# Patient Record
Sex: Male | Born: 1980 | Race: White | Hispanic: No | Marital: Married | Smoking: Former smoker
Health system: Southern US, Community
[De-identification: ages and names within clinical notes are randomized; demographics above are authoritative.]

## PROBLEM LIST (undated history)

## (undated) DIAGNOSIS — N419 Inflammatory disease of prostate, unspecified: Secondary | ICD-10-CM

## (undated) DIAGNOSIS — M1990 Unspecified osteoarthritis, unspecified site: Secondary | ICD-10-CM

## (undated) HISTORY — PX: APPENDECTOMY: SHX54

---

## 2006-10-14 ENCOUNTER — Inpatient Hospital Stay: Payer: Self-pay | Admitting: Vascular Surgery

## 2006-11-04 ENCOUNTER — Emergency Department: Payer: Self-pay | Admitting: Emergency Medicine

## 2007-07-25 ENCOUNTER — Emergency Department: Payer: Self-pay | Admitting: Emergency Medicine

## 2008-01-01 ENCOUNTER — Emergency Department: Payer: Self-pay | Admitting: Emergency Medicine

## 2008-01-27 ENCOUNTER — Emergency Department: Payer: Self-pay | Admitting: Emergency Medicine

## 2009-02-17 ENCOUNTER — Emergency Department: Payer: Self-pay | Admitting: Emergency Medicine

## 2009-06-22 ENCOUNTER — Emergency Department: Payer: Self-pay | Admitting: Emergency Medicine

## 2009-06-24 ENCOUNTER — Emergency Department: Payer: Self-pay | Admitting: Emergency Medicine

## 2009-07-07 ENCOUNTER — Inpatient Hospital Stay: Payer: Self-pay | Admitting: Psychiatry

## 2010-06-06 ENCOUNTER — Emergency Department: Payer: Self-pay | Admitting: Emergency Medicine

## 2011-01-11 ENCOUNTER — Emergency Department: Payer: Self-pay | Admitting: Emergency Medicine

## 2011-05-28 ENCOUNTER — Encounter: Payer: Self-pay | Admitting: *Deleted

## 2011-05-28 ENCOUNTER — Emergency Department (HOSPITAL_COMMUNITY)
Admission: EM | Admit: 2011-05-28 | Discharge: 2011-05-28 | Disposition: A | Discharge status: Home / Self Care | Payer: Self-pay | Attending: Emergency Medicine | Admitting: Emergency Medicine

## 2011-05-28 DIAGNOSIS — J45909 Unspecified asthma, uncomplicated: Secondary | ICD-10-CM | POA: Insufficient documentation

## 2011-05-28 DIAGNOSIS — F17200 Nicotine dependence, unspecified, uncomplicated: Secondary | ICD-10-CM | POA: Insufficient documentation

## 2011-05-28 DIAGNOSIS — N419 Inflammatory disease of prostate, unspecified: Secondary | ICD-10-CM | POA: Insufficient documentation

## 2011-05-28 HISTORY — DX: Inflammatory disease of prostate, unspecified: N41.9

## 2011-05-28 MED ORDER — IBUPROFEN 800 MG PO TABS
800.0000 mg | ORAL_TABLET | Freq: Once | ORAL | Status: AC
  Administered 2011-05-28: 800 mg via ORAL
  Filled 2011-05-28: qty 1

## 2011-05-28 MED ORDER — CIPROFLOXACIN HCL 250 MG PO TABS
500.0000 mg | ORAL_TABLET | Freq: Once | ORAL | Status: AC
  Administered 2011-05-28: 500 mg via ORAL
  Filled 2011-05-28: qty 2

## 2011-05-28 MED ORDER — CIPROFLOXACIN HCL 500 MG PO TABS: 500.0000 mg | ORAL_TABLET | Freq: Two times a day (BID) | ORAL | Status: AC

## 2011-05-28 MED ORDER — IBUPROFEN 800 MG PO TABS: 800.0000 mg | ORAL_TABLET | Freq: Three times a day (TID) | ORAL | Status: AC

## 2011-05-28 NOTE — ED Notes (Signed)
Pt c/o of pain to the area between his rectum and testicles.

## 2011-05-28 NOTE — ED Notes (Signed)
Pt not given prescriptions for meds, Pt called at home and notifed that he needs to come get prescriptions; pt states he will be here later today to pick up prescrips

## 2011-05-28 NOTE — ED Provider Notes (Signed)
History     CSN: [id number] Arrival date & time: 05/28/2011  1:55 AM   Chief Complaint  Patient presents with  . Groin Pain     (Include location/radiation/quality/duration/timing/severity/associated sxs/prior treatment) HPI Comments: Seen 0212  Patient is a 30 y.o. male presenting with groin pain. The history is provided by the patient.  Groin Pain This is a new (Patient with h/o prostatitis now with similar symptoms, pressure and discomfort to the perineal ara.) problem. The current episode started yesterday. The problem has not changed since onset.Exacerbated by: more prevalent when bearing down to have a BM or urinate. The symptoms are relieved by nothing. He has tried nothing for the symptoms.     Past Medical History  Diagnosis Date  . Asthma   . Prostatitis      Past Surgical History  Procedure Date  . Appendectomy     History reviewed. No pertinent family history.  History  Substance Use Topics  . Smoking status: Current Everyday Smoker  . Smokeless tobacco: Not on file  . Alcohol Use: No      Review of Systems  Genitourinary:       Prostate pain    Allergies  Review of patient's allergies indicates no known allergies.  Home Medications  No current outpatient prescriptions on file.  Physical Exam    BP 138/83  Pulse 71  Temp 98.4 F (36.9 C)  Resp 20  Ht 6\' 2"  (1.88 m)  Wt 230 lb (104.327 kg)  BMI 29.53 kg/m2  SpO2 98%  Physical Exam  Nursing note and vitals reviewed. Constitutional: He is oriented to person, place, and time. He appears well-developed and well-nourished.  HENT:  Head: Normocephalic and atraumatic.  Eyes: EOM are normal.  Neck: Normal range of motion.  Cardiovascular: Normal rate and normal heart sounds.   Pulmonary/Chest: Effort normal and breath sounds normal.  Abdominal: Soft.  Genitourinary: Penis normal.       Prostate tenderness  Musculoskeletal: Normal range of motion.  Neurological: He is alert and oriented  to person, place, and time.  Skin: Skin is warm and dry.    ED Course  Procedures  Patient with h/o prostatitis with similar pain that began yesterday. Began treatment with ciprofloxacin. Referral to urology for follow up. Patient / Family / Caregiver informed of clinical course, understand medical decision-making process, and agree with plan. MDM Reviewed: nursing note and vitals          Nicoletta Dress. Colon Branch, MD 05/28/11 [phone]

## 2011-06-20 ENCOUNTER — Emergency Department (HOSPITAL_COMMUNITY)
Admission: EM | Admit: 2011-06-20 | Discharge: 2011-06-20 | Disposition: A | Discharge status: Home / Self Care | Payer: Self-pay | Attending: Emergency Medicine | Admitting: Emergency Medicine

## 2011-06-20 ENCOUNTER — Encounter (HOSPITAL_COMMUNITY): Payer: Self-pay | Admitting: *Deleted

## 2011-06-20 DIAGNOSIS — F17200 Nicotine dependence, unspecified, uncomplicated: Secondary | ICD-10-CM | POA: Insufficient documentation

## 2011-06-20 DIAGNOSIS — R112 Nausea with vomiting, unspecified: Secondary | ICD-10-CM | POA: Insufficient documentation

## 2011-06-20 DIAGNOSIS — R109 Unspecified abdominal pain: Secondary | ICD-10-CM | POA: Insufficient documentation

## 2011-06-20 DIAGNOSIS — R197 Diarrhea, unspecified: Secondary | ICD-10-CM | POA: Insufficient documentation

## 2011-06-20 LAB — COMPREHENSIVE METABOLIC PANEL
AST: 18 U/L (ref 0–37)
BUN: 22 mg/dL (ref 6–23)
CO2: 27 mEq/L (ref 19–32)
Chloride: 100 mEq/L (ref 96–112)
Creatinine, Ser: 0.92 mg/dL (ref 0.50–1.35)
GFR calc non Af Amer: >90 mL/min (ref >90)
Total Bilirubin: 0.7 mg/dL (ref 0.3–1.2)

## 2011-06-20 LAB — CBC
HCT: 48.1 % (ref 39.0–52.0)
Hemoglobin: 17 g/dL (ref 13.0–17.0)
MCH: 29.6 pg (ref 26.0–34.0)
MCHC: 35.3 g/dL (ref 30.0–36.0)
MCV: 83.7 fL (ref 78.0–100.0)
Platelets: 157 10*3/uL (ref 150–400)
RBC: 5.75 MIL/uL (ref 4.22–5.81)
RDW: 12.3 % (ref 11.5–15.5)
WBC: 4.1 10*3/uL (ref 4.0–10.5)

## 2011-06-20 LAB — DIFFERENTIAL
Basophils Absolute: 0 10*3/uL (ref 0.0–0.1)
Basophils Relative: 1 % (ref 0–1)
Eosinophils Absolute: 0.2 10*3/uL (ref 0.0–0.7)
Eosinophils Relative: 6 % — ABNORMAL HIGH (ref 0–5)
Lymphocytes Relative: 35 % (ref 12–46)
Lymphs Abs: 1.4 10*3/uL (ref 0.7–4.0)
Monocytes Absolute: 0.8 10*3/uL (ref 0.1–1.0)
Monocytes Relative: 18 % — ABNORMAL HIGH (ref 3–12)
Neutro Abs: 1.7 10*3/uL (ref 1.7–7.7)
Neutrophils Relative %: 41 % — ABNORMAL LOW (ref 43–77)

## 2011-06-20 MED ORDER — SODIUM CHLORIDE 0.9 % IV SOLN
999.0000 mL | Freq: Once | INTRAVENOUS | Status: AC
  Administered 2011-06-20: 999 mL via INTRAVENOUS

## 2011-06-20 MED ORDER — ONDANSETRON HCL 4 MG/2ML IJ SOLN
4.0000 mg | Freq: Once | INTRAMUSCULAR | Status: AC
  Administered 2011-06-20: 4 mg via INTRAVENOUS
  Filled 2011-06-20: qty 2

## 2011-06-20 MED ORDER — ONDANSETRON HCL 4 MG PO TABS: 4.0000 mg | ORAL_TABLET | Freq: Four times a day (QID) | ORAL | Status: AC

## 2011-06-20 NOTE — ED Provider Notes (Signed)
Scribed for Gerhard Munch, MD, the patient was seen in room APA06/APA06 . This chart was scribed by Ellie Lunch. This patient's care was started at 5:25 PM.   CSN: [id number] Arrival date & time: 06/20/2011  5:17 PM  Chief Complaint  Patient presents with  . Abdominal Pain  . Emesis  . Diarrhea    (Consider location/radiation/quality/duration/timing/severity/associated sxs/prior treatment) HPI Edward Griffin is a 30 y.o. male who presents to the Emergency Department complaining of nausea, vomiting, and diarrhea with associated abdominal cramping starting 2 days ago. Pt reports he began to feel ill 3 days ago and began to have n/v/d 2 days ago. He has not been able to hold any food or drink down since then. Pt reports n/v/d is aggravated by eating. Pt treated sx with motion sickness medicine and IB profen with no improvement. Also reports body aches, HA ( X1day), with some slurred speech (difficulty pronouncing words). Denies confusion, SOB, CP, cough, swelling, or urinary Sx.  Additionally Pt was here in ED ~ 3 weeks ago for prostatitis was given cipro. Last dose was ~2 weeks ago.   Past Medical History  Diagnosis Date  . Asthma   . Prostatitis 2 times     Past Surgical History  Procedure Date  . Appendectomy     History reviewed. No pertinent family history.  History  Substance Use Topics  . Smoking status: Current Everyday Smoker -- 0.5 packs/day  . Smokeless tobacco: Not on file  . Alcohol Use: No    Review of Systems 10 Systems reviewed and are negative for acute change except as noted in the HPI.  Allergies  Review of patient's allergies indicates no known allergies.  Home Medications  No current outpatient prescriptions on file.  BP 128/79  Pulse 94  Temp(Src) 97.6 F (36.4 C) (Oral)  Resp 20  Ht 6\' 1"  (1.854 m)  Wt 225 lb (102.059 kg)  BMI 29.69 kg/m2  SpO2 99%  Physical Exam  Nursing note and vitals reviewed. Constitutional: He is oriented to  person, place, and time. He appears well-developed and well-nourished.  HENT:  Head: Normocephalic and atraumatic.       Mucous membranes moist.  Eyes: Conjunctivae are normal. Pupils are equal, round, and reactive to light.  Neck: Neck supple.  Cardiovascular: Normal rate, regular rhythm, normal heart sounds and intact distal pulses.   No murmur heard. Pulmonary/Chest: Effort normal and breath sounds normal. No respiratory distress.  Abdominal: Soft. There is tenderness (LUQ tenderness).       No CVA tenderness  Musculoskeletal: He exhibits no edema.  Neurological: He is alert and oriented to person, place, and time.  Skin: Skin is warm and dry.  Psychiatric: He has a normal mood and affect.   Procedures (including critical care time)  OTHER DATA REVIEWED: Nursing notes, vital signs, and past medical records reviewed.  DIAGNOSTIC STUDIES: Oxygen Saturation is 99% on room air, normal by my interpretation.    LABS / RADIOLOGY:  Labs Reviewed  DIFFERENTIAL - Abnormal; Notable for the following:    Neutrophils Relative 41 (*)    Monocytes Relative 18 (*)    Eosinophils Relative 6 (*)    All other components within normal limits  CBC  COMPREHENSIVE METABOLIC PANEL  LIPASE, BLOOD    ED COURSE / COORDINATION OF CARE: 17:30 EDP at PT bedside discussed diagnostic possibilities with PT including viral or bacterial infection. Plan to treat IV fluids, nausea medicine, check blood work.   MDM: This well-appearing young  male presents with several days of nausea, vomiting, and diarrhea. Physical exam is unremarkable. Labs did not demonstrate acute findings. Following IV fluids, and anti-emetics, the patient tolerance of liquids and noted that he feels significantly better. Absent any acute findings, or fever, or notable lab results, the patient's presentation is most consistent with viral gastroenteritis. He was discharged with antiemetics, return precautions, and PMD followup  MEDICATIONS  GIVEN IN THE E.D.  Medications  ondansetron (ZOFRAN) injection 4 mg (not administered)  0.9 %  sodium chloride infusion (not administered)   SCRIBE ATTESTATION: I personally performed the services described in this documentation, which was scribed in my presence. The recorded information has been reviewed and considered.         Gerhard Munch, MD 06/20/11 [id number]

## 2011-06-20 NOTE — ED Notes (Signed)
Pt c/o abd pain with n/v/d. Pt states he is not able to hold any food or drink down since Sunday of this week.

## 2011-10-27 ENCOUNTER — Emergency Department: Payer: Self-pay | Admitting: Emergency Medicine

## 2011-10-27 LAB — URINALYSIS, COMPLETE
Bacteria: NONE SEEN
Bilirubin,UR: NEGATIVE
Glucose,UR: 50 mg/dL (ref 0–75)
Leukocyte Esterase: NEGATIVE
RBC,UR: NONE SEEN /HPF (ref 0–5)
Squamous Epithelial: NONE SEEN

## 2011-11-15 ENCOUNTER — Emergency Department: Payer: Self-pay | Admitting: Emergency Medicine

## 2012-05-14 ENCOUNTER — Emergency Department: Payer: Self-pay | Admitting: Emergency Medicine

## 2013-03-07 ENCOUNTER — Ambulatory Visit: Payer: Self-pay | Admitting: Family Medicine

## 2013-03-09 ENCOUNTER — Ambulatory Visit: Payer: Self-pay

## 2013-03-18 ENCOUNTER — Ambulatory Visit: Payer: Self-pay | Admitting: Family Medicine

## 2014-03-23 ENCOUNTER — Emergency Department: Payer: Self-pay | Admitting: Emergency Medicine

## 2014-07-22 ENCOUNTER — Emergency Department: Payer: Self-pay | Admitting: Student

## 2014-07-23 ENCOUNTER — Emergency Department: Payer: Self-pay | Admitting: Student

## 2014-11-28 ENCOUNTER — Emergency Department: Payer: Self-pay

## 2015-05-07 ENCOUNTER — Other Ambulatory Visit: Payer: Self-pay | Admitting: Neurosurgery

## 2015-05-07 DIAGNOSIS — M5416 Radiculopathy, lumbar region: Secondary | ICD-10-CM

## 2015-05-12 ENCOUNTER — Ambulatory Visit
Admission: RE | Admit: 2015-05-12 | Discharge: 2015-05-12 | Disposition: A | Discharge status: Home / Self Care | Payer: Worker's Compensation | Source: Ambulatory Visit | Attending: Neurosurgery | Admitting: Neurosurgery

## 2015-05-12 DIAGNOSIS — M5416 Radiculopathy, lumbar region: Secondary | ICD-10-CM

## 2015-05-12 MED ORDER — MEPERIDINE HCL 100 MG/ML IJ SOLN
75.0000 mg | Freq: Once | INTRAMUSCULAR | Status: AC
  Administered 2015-05-12: 75 mg via INTRAMUSCULAR

## 2015-05-12 MED ORDER — ONDANSETRON HCL 4 MG/2ML IJ SOLN
4.0000 mg | Freq: Once | INTRAMUSCULAR | Status: AC
  Administered 2015-05-12: 4 mg via INTRAMUSCULAR

## 2015-05-12 MED ORDER — DIAZEPAM 5 MG PO TABS
10.0000 mg | ORAL_TABLET | Freq: Once | ORAL | Status: AC
  Administered 2015-05-12: 10 mg via ORAL

## 2015-05-12 MED ORDER — IOHEXOL 180 MG/ML  SOLN
15.0000 mL | Freq: Once | INTRAMUSCULAR | Status: DC | PRN
  Administered 2015-05-12: 15 mL via INTRATHECAL

## 2015-05-12 NOTE — Discharge Instructions (Signed)

## 2015-05-13 ENCOUNTER — Encounter: Payer: Self-pay | Admitting: Internal Medicine

## 2015-05-13 ENCOUNTER — Observation Stay
Admission: EM | Admit: 2015-05-13 | Discharge: 2015-05-15 | Disposition: A | Discharge status: Home / Self Care | Payer: Worker's Compensation | Attending: Internal Medicine | Admitting: Internal Medicine

## 2015-05-13 DIAGNOSIS — Z888 Allergy status to other drugs, medicaments and biological substances status: Secondary | ICD-10-CM | POA: Insufficient documentation

## 2015-05-13 DIAGNOSIS — F17200 Nicotine dependence, unspecified, uncomplicated: Secondary | ICD-10-CM | POA: Diagnosis not present

## 2015-05-13 DIAGNOSIS — J45909 Unspecified asthma, uncomplicated: Secondary | ICD-10-CM

## 2015-05-13 DIAGNOSIS — M79605 Pain in left leg: Secondary | ICD-10-CM | POA: Insufficient documentation

## 2015-05-13 DIAGNOSIS — M4806 Spinal stenosis, lumbar region: Secondary | ICD-10-CM | POA: Diagnosis not present

## 2015-05-13 DIAGNOSIS — M5416 Radiculopathy, lumbar region: Secondary | ICD-10-CM | POA: Diagnosis present

## 2015-05-13 DIAGNOSIS — R51 Headache: Secondary | ICD-10-CM | POA: Diagnosis not present

## 2015-05-13 DIAGNOSIS — Z91030 Bee allergy status: Secondary | ICD-10-CM | POA: Diagnosis not present

## 2015-05-13 DIAGNOSIS — G971 Other reaction to spinal and lumbar puncture: Principal | ICD-10-CM | POA: Diagnosis present

## 2015-05-13 DIAGNOSIS — R200 Anesthesia of skin: Secondary | ICD-10-CM | POA: Insufficient documentation

## 2015-05-13 DIAGNOSIS — M542 Cervicalgia: Secondary | ICD-10-CM | POA: Diagnosis not present

## 2015-05-13 DIAGNOSIS — Z79899 Other long term (current) drug therapy: Secondary | ICD-10-CM | POA: Insufficient documentation

## 2015-05-13 DIAGNOSIS — M1990 Unspecified osteoarthritis, unspecified site: Secondary | ICD-10-CM | POA: Diagnosis not present

## 2015-05-13 DIAGNOSIS — R599 Enlarged lymph nodes, unspecified: Secondary | ICD-10-CM | POA: Insufficient documentation

## 2015-05-13 HISTORY — DX: Unspecified osteoarthritis, unspecified site: M19.90

## 2015-05-13 MED ORDER — SODIUM CHLORIDE 0.9 % IV SOLN
Freq: Once | INTRAVENOUS | Status: AC
  Administered 2015-05-13: 23:00:00 via INTRAVENOUS

## 2015-05-13 NOTE — ED Provider Notes (Signed)
[facility] Emergency Department Provider Note  ____________________________________________  Time seen: Approximately 10:44 PM  I have reviewed the triage vital signs and the nursing notes.   HISTORY  Chief Complaint Headache; Arm Pain; and Neck Pain    HPI Edward Griffin is a 34 y.o. male who had a lumbar myelogram yesterday. Patient reports when he got home afterwards she began to get a headache. This headache became bad when he stood up and disappeared when he laid down. He began drinking lots of fluid began drinking caffeinated beverages. However the headache continued to worsen and while it initially would go away rapidly when he laid down. It now no longer goes Y completely but it does worsen when he sits up or stands up and he says the bathroom break results in a severe headache that has now progressed to include his neck and his left arm. This headache in the next headache improves when he lays down and neckache and arm pain goes away when he lays down but returns again if he has to go the bathroom patient denies any fever patient denies any neck pain if he is lying down or pain with eye movement either.   Past Medical History  Diagnosis Date  . Asthma   . Prostatitis     There are no active problems to display for this patient.   Past Surgical History  Procedure Laterality Date  . Appendectomy      Current Outpatient Rx  Name  Route  Sig  Dispense  Refill  . albuterol (VENTOLIN HFA) 108 (90 BASE) MCG/ACT inhaler   Inhalation   Inhale 2 puffs into the lungs every 6 (six) hours as needed. For shortness of breath            Allergies Bee venom and Gabapentin  No family history on file.  Social History Social History  Substance Use Topics  . Smoking status: Current Every Day Smoker -- 0.50 packs/day  . Smokeless tobacco: Not on file  . Alcohol Use: No    Review of Systems Constitutional: No fever/chills Eyes: No visual  changes while he is laying down. When he stands up now he also has blurry vision along with the neck pain in the arm pain. This again resolved when he lays down ENT: No sore throat. Cardiovascular: Denies chest pain. Respiratory: Denies shortness of breath. Gastrointestinal: No abdominal pain.  No nausea, no vomiting.  No diarrhea.  No constipation. Genitourinary: Negative for dysuria. Musculoskeletal: Negative for back pain. Skin: Negative for rash. Neurological: Negative for headaches, focal weakness or numbness.  10-point ROS otherwise negative.  ____________________________________________   PHYSICAL EXAM:  VITAL SIGNS: ED Triage Vitals  Enc Vitals Group     BP 05/13/15 2200 161/96 mmHg     Pulse Rate 05/13/15 2200 79     Resp 05/13/15 2200 18     Temp 05/13/15 2200 97.6 F (36.4 C)     Temp Source 05/13/15 2200 Oral     SpO2 05/13/15 2200 97 %     Weight 05/13/15 2200 240 lb (108.863 kg)     Height 05/13/15 2200 6\' 1"  (1.854 m)     Head Cir --      Peak Flow --      Pain Score 05/13/15 2201 10     Pain Loc --      Pain Edu? --      Excl. in GC? --     Constitutional: Alert and oriented. Well  appearing and in no acute distress. Eyes: Conjunctivae are normal. PERRL. EOMI. Head: Atraumatic. Nose: No congestion/rhinnorhea. Mouth/Throat: Mucous membranes are moist.  Oropharynx non-erythematous. Neck: No stridor.   Cardiovascular: Normal rate, regular rhythm. Grossly normal heart sounds.  Good peripheral circulation. Respiratory: Normal respiratory effort.  No retractions. Lungs CTAB. Gastrointestinal: Soft and nontender. No distention. No abdominal bruits. No CVA tenderness. Musculoskeletal: No lower extremity tenderness nor edema.  No joint effusions. Neurologic:  Normal speech and language. No gross focal neurologic deficits are appreciated. Cranial nerves II through XII are intact. Finger to nose and rapid alternating movements and hands are normal. Her strength is 5  over 5 throughout except for his left leg which she reports even pushing down on his leg to test his strength causes pain from his sciatica. He has no new weakness in the leg however. Skin:  Skin is warm, dry and intact. No rash noted. Psychiatric: Mood and affect are normal. Speech and behavior are normal.  ____________________________________________   LABS (all labs ordered are listed, but only abnormal results are displayed)  Labs Reviewed - No data to display ____________________________________________  EKG   ____________________________________________  RADIOLOGY   ____________________________________________   PROCEDURES   ____________________________________________   INITIAL IMPRESSION / ASSESSMENT AND PLAN / ED COURSE  Pertinent labs & imaging results that were available during my care of the patient were reviewed by me and considered in my medical decision making (see chart for details).   ____________________________________________   FINAL CLINICAL IMPRESSION(S) / ED DIAGNOSES  Final diagnoses:  Spinal headache      Arnaldo Natal, MD 05/13/15 2313

## 2015-05-13 NOTE — H&P (Signed)
[facility] - [place] at [facility]   PATIENT NAME: Edward Griffin    MR#:  [id number]  DATE OF BIRTH:  [birth year]  DATE OF ADMISSION:  05/13/2015  PRIMARY CARE PHYSICIAN: No primary care provider on file. [facility]  REQUESTING/REFERRING PHYSICIAN: Marge Duncans  CHIEF COMPLAINT:   Chief Complaint  Patient presents with  . Headache  . Arm Pain  . Neck Pain    HISTORY OF PRESENT ILLNESS:  Edward Griffin  is a 35 y.o. male with a known history of lumbar adenopathy, bronchial asthma presents to the emergency room with the complaints of severe headache following lumbar puncture to undergo lumbar myelogram done on 05/12/2015 for evaluation of lumbar adenopathy. Headache increases on standing elaboration and resolves with lying down. Associated neck and arm pain present but denies any loss of sensations are power. Feels slight blurred vision on ambulation but no visual problems at rest. No history of any fever, chills, shortness of breath, chest pain, nausea, vomiting, diarrhea, dysuria, bladder or bowel disturbances. Denies any focal weakness or numbness. Chronic sciatica +.   PAST MEDICAL HISTORY:   Past Medical History  Diagnosis Date  . Asthma   . Prostatitis   . Arthritis     PAST SURGICAL HISTORY:   Past Surgical History  Procedure Laterality Date  . Appendectomy      SOCIAL HISTORY:   Social History  Substance Use Topics  . Smoking status: Current Every Day Smoker -- 0.50 packs/day  . Smokeless tobacco: Not on file  . Alcohol Use: No    FAMILY HISTORY:   Family History  Problem Relation Age of Onset  . Heart disease Mother   . Thyroid disease Mother   . Hypertension Father   . Diabetes Other     DRUG ALLERGIES:   Allergies  Allergen Reactions  . Bee Venom Shortness Of Breath and Swelling  . Gabapentin Other (See Comments)    Reaction: Flu-like symptoms     REVIEW OF SYSTEMS:   Review of Systems   Constitutional: Negative for fever, chills and malaise/fatigue.  HENT: Negative for ear pain, hearing loss, nosebleeds, sore throat and tinnitus.   Eyes: Positive for blurred vision (On standing but no visual symptoms while lying down). Negative for double vision, pain, discharge and redness.  Respiratory: Negative for cough, hemoptysis, sputum production, shortness of breath and wheezing.   Cardiovascular: Negative for chest pain, palpitations, orthopnea and leg swelling.  Gastrointestinal: Negative for nausea, vomiting, abdominal pain, diarrhea, constipation, blood in stool and melena.  Genitourinary: Negative for dysuria, urgency, frequency and hematuria.  Musculoskeletal: Positive for back pain (chronic +). Negative for joint pain and neck pain.  Skin: Negative for itching and rash.  Neurological: Positive for headaches. Negative for dizziness, tingling, sensory change, focal weakness and seizures.  Endo/Heme/Allergies: Does not bruise/bleed easily.  Psychiatric/Behavioral: Negative for depression. The patient is not nervous/anxious.     MEDICATIONS AT HOME:   Prior to Admission medications   Medication Sig Start Date End Date Taking? Authorizing Provider  albuterol (VENTOLIN HFA) 108 (90 BASE) MCG/ACT inhaler Inhale 2 puffs into the lungs every 6 (six) hours as needed. For shortness of breath     Historical Provider, MD      VITAL SIGNS:  Blood pressure 130/75, pulse 68, temperature 97.6 F (36.4 C), temperature source Oral, resp. rate 18, height 6\' 1"  (1.854 m), weight 108.863 kg (240 lb), SpO2 97 %.  PHYSICAL EXAMINATION:  Physical Exam  Constitutional: He  is oriented to person, place, and time. He appears well-developed and well-nourished.  Mild distress + because of headache  HENT:  Head: Normocephalic and atraumatic.  Right Ear: External ear normal.  Left Ear: External ear normal.  Nose: Nose normal.  Mouth/Throat: Oropharynx is clear and moist. No oropharyngeal  exudate.  Eyes: EOM are normal. Pupils are equal, round, and reactive to light. No scleral icterus.  Neck: Normal range of motion. Neck supple. No JVD present. No thyromegaly present.  Cardiovascular: Normal rate, regular rhythm, normal heart sounds and intact distal pulses.  Exam reveals no friction rub.   No murmur heard. Respiratory: Effort normal and breath sounds normal. No respiratory distress. He has no wheezes. He has no rales. He exhibits no tenderness.  GI: Soft. Bowel sounds are normal. He exhibits no distension and no mass. There is no tenderness. There is no rebound and no guarding.  Musculoskeletal: Normal range of motion. He exhibits no edema.  Lymphadenopathy:    He has no cervical adenopathy.  Neurological: He is alert and oriented to person, place, and time. He has normal reflexes. He displays normal reflexes. No cranial nerve deficit. He exhibits normal muscle tone.  Skin: Skin is warm. No rash noted. No erythema.  Psychiatric: He has a normal mood and affect. His behavior is normal. Thought content normal.   LABORATORY PANEL:   CBC No results for input(s): WBC, HGB, HCT, PLT in the last 168 hours. ------------------------------------------------------------------------------------------------------------------  Chemistries  No results for input(s): NA, K, CL, CO2, GLUCOSE, BUN, CREATININE, CALCIUM, MG, AST, ALT, ALKPHOS, BILITOT in the last 168 hours.  Invalid input(s): GFRCGP ------------------------------------------------------------------------------------------------------------------  Cardiac Enzymes No results for input(s): TROPONINI in the last 168 hours. ------------------------------------------------------------------------------------------------------------------  RADIOLOGY:  Ct Lumbar Spine W Contrast  05/12/2015   CLINICAL DATA:  Lumbar radiculopathy. Left-sided low back/buttock pain radiating through the posterior left lower extremity.  EXAM: LUMBAR  MYELOGRAM  FLUOROSCOPY TIME:  Fluoroscopy Time (in minutes and seconds): 0 minutes 53 seconds  Number of Acquired Images:  14  PROCEDURE: After thorough discussion of risks and benefits of the procedure including bleeding, infection, injury to nerves, blood vessels, adjacent structures as well as headache and CSF leak, written and oral informed consent was obtained. Consent was obtained by Dr. Sebastian Ache. Time out form was completed.  Patient was positioned prone on the fluoroscopy table. Local anesthesia was provided with 1% lidocaine without epinephrine after prepped and draped in the usual sterile fashion. Puncture was performed at L3-4 using a 3 1/2 inch 22-gauge spinal needle via a right paramedian approach. Using a single pass through the dura, the needle was placed within the thecal sac, with return of clear CSF. 15 mL of Omnipaque-180 was injected into the thecal sac, with normal opacification of the nerve roots and cauda equina consistent with free flow within the subarachnoid space.  I personally performed the lumbar puncture and administered the intrathecal contrast. I also personally supervised acquisition of the myelogram images.  TECHNIQUE: Contiguous axial images were obtained through the Lumbar spine after the intrathecal infusion of contrast. Coronal and sagittal reconstructions were obtained of the axial image sets.  COMPARISON:  Lumbar spine MRI 12/25/2014 from [facility]. Lumbar spine radiographs 11/28/2014 and 06/06/2010.  FINDINGS: LUMBAR MYELOGRAM FINDINGS:  There is transitional lumbosacral anatomy. For the purposes of this dictation, the transitional level will be considered a partially sacralized L5 with left-sided assimilation joint with the sacrum.  Minimal anterior wedging of the L1-L3 vertebral bodies appears chronic. There  is no significant listhesis with upright neutral, flexion, or extension positioning. Small ventral extradural defects are present from L2-3 to L5-S1, most  notable at L4-5 and slightly increased with upright compared to prone positioning. There is attenuation of both L5 nerve root sleeves.  CT LUMBAR MYELOGRAM FINDINGS:  Vertebral alignment is near anatomic, with at most trace retrolisthesis of L3 on L4 and L4 on L5. There is minimal anterior wedging of the L1-L3 vertebral bodies which appears chronic. A few small Schmorl's nodes are noted. The conus medullaris terminates at T12. A small amount of subdural contrast is noted in the lower lumbar spine reflecting a slightly mixed injection. Paraspinal soft tissues are unremarkable.  L1-2:  Negative.  L2-3: Mild disc bulging results in minimal bilateral lateral recess narrowing without spinal canal or neural foraminal stenosis, unchanged.  L3-4: Mild disc bulging and mild facet hypertrophy result in minimal bilateral lateral recess narrowing and borderline spinal stenosis without neural foraminal stenosis, not significantly changed.  L4-5: A moderate-sized left subarticular disc extrusion results in severe left lateral recess stenosis with likely impingement of the left L5 nerve root, which appeared enlarged/edematous on the prior outside MRI. The disc extrusion, mild circumferential disc bulging, and moderate facet and ligamentum flavum hypertrophy also result in mild right lateral recess stenosis and moderate spinal stenosis, with the findings overall slightly more prominent than on the prior MRI.  L5-S1:  Mild facet arthrosis without stenosis.  IMPRESSION: 1. Left subarticular disc extrusion at L4-5 with likely left L5 nerve root impingement in the lateral recess. Moderate spinal stenosis at L4-5. 2. Mild disc and facet degeneration elsewhere as above without evidence of neural impingement.   Electronically Signed   By: Sebastian Ache M.D.   On: 05/12/2015 15:57   Dg Myelography Lumbar Inj Lumbosacral  05/12/2015   CLINICAL DATA:  Lumbar radiculopathy. Left-sided low back/buttock pain radiating through the posterior  left lower extremity.  EXAM: LUMBAR MYELOGRAM  FLUOROSCOPY TIME:  Fluoroscopy Time (in minutes and seconds): 0 minutes 53 seconds  Number of Acquired Images:  14  PROCEDURE: After thorough discussion of risks and benefits of the procedure including bleeding, infection, injury to nerves, blood vessels, adjacent structures as well as headache and CSF leak, written and oral informed consent was obtained. Consent was obtained by Dr. Sebastian Ache. Time out form was completed.  Patient was positioned prone on the fluoroscopy table. Local anesthesia was provided with 1% lidocaine without epinephrine after prepped and draped in the usual sterile fashion. Puncture was performed at L3-4 using a 3 1/2 inch 22-gauge spinal needle via a right paramedian approach. Using a single pass through the dura, the needle was placed within the thecal sac, with return of clear CSF. 15 mL of Omnipaque-180 was injected into the thecal sac, with normal opacification of the nerve roots and cauda equina consistent with free flow within the subarachnoid space.  I personally performed the lumbar puncture and administered the intrathecal contrast. I also personally supervised acquisition of the myelogram images.  TECHNIQUE: Contiguous axial images were obtained through the Lumbar spine after the intrathecal infusion of contrast. Coronal and sagittal reconstructions were obtained of the axial image sets.  COMPARISON:  Lumbar spine MRI 12/25/2014 from [facility]. Lumbar spine radiographs 11/28/2014 and 06/06/2010.  FINDINGS: LUMBAR MYELOGRAM FINDINGS:  There is transitional lumbosacral anatomy. For the purposes of this dictation, the transitional level will be considered a partially sacralized L5 with left-sided assimilation joint with the sacrum.  Minimal anterior wedging of the  L1-L3 vertebral bodies appears chronic. There is no significant listhesis with upright neutral, flexion, or extension positioning. Small ventral extradural defects are  present from L2-3 to L5-S1, most notable at L4-5 and slightly increased with upright compared to prone positioning. There is attenuation of both L5 nerve root sleeves.  CT LUMBAR MYELOGRAM FINDINGS:  Vertebral alignment is near anatomic, with at most trace retrolisthesis of L3 on L4 and L4 on L5. There is minimal anterior wedging of the L1-L3 vertebral bodies which appears chronic. A few small Schmorl's nodes are noted. The conus medullaris terminates at T12. A small amount of subdural contrast is noted in the lower lumbar spine reflecting a slightly mixed injection. Paraspinal soft tissues are unremarkable.  L1-2:  Negative.  L2-3: Mild disc bulging results in minimal bilateral lateral recess narrowing without spinal canal or neural foraminal stenosis, unchanged.  L3-4: Mild disc bulging and mild facet hypertrophy result in minimal bilateral lateral recess narrowing and borderline spinal stenosis without neural foraminal stenosis, not significantly changed.  L4-5: A moderate-sized left subarticular disc extrusion results in severe left lateral recess stenosis with likely impingement of the left L5 nerve root, which appeared enlarged/edematous on the prior outside MRI. The disc extrusion, mild circumferential disc bulging, and moderate facet and ligamentum flavum hypertrophy also result in mild right lateral recess stenosis and moderate spinal stenosis, with the findings overall slightly more prominent than on the prior MRI.  L5-S1:  Mild facet arthrosis without stenosis.  IMPRESSION: 1. Left subarticular disc extrusion at L4-5 with likely left L5 nerve root impingement in the lateral recess. Moderate spinal stenosis at L4-5. 2. Mild disc and facet degeneration elsewhere as above without evidence of neural impingement.   Electronically Signed   By: Sebastian Ache M.D.   On: 05/12/2015 15:57    EKG:  No orders found for this or any previous visit.  IMPRESSION AND PLAN:   34 year old male with history of lumbar  adenopathy presents with severe headache following lumbar puncture to undergo lumbar myelogram on 05/12/2015. 1. Headache-spinal, following lumbar puncture. Normal neuro examination, no febrile illness. Plan: Admit for observation, bedrest, IV hydration, IV pain control medications. Consider blood patch application in a.m. if headache not controlled. 2. Left-sided lumbar radicle adenopathy, status post recent lumbar myelo gram. Stable on when necessary pain medications. 3. History of bronchial asthma, mild. Stable, no acute problems. Monitor. Will obtain CBC and BMP for baseline.    All the records are reviewed and case discussed with ED provider. Management plans discussed with the patient, family and they are in agreement.  CODE STATUS: Full code  TOTAL TIME TAKING CARE OF THIS PATIENT:  40 minutes.    Jonnie Kind N M.D on 05/13/2015 at 11:43 PM  Between 7am to 6pm - Pager - [phone]  After 6pm go to [email or web address] - password [email or web address] [facility]  [facility]  Office  [phone]  CC: Primary care physician; No primary care provider on file.

## 2015-05-13 NOTE — ED Notes (Signed)
Pt to ED c/o headache, and neck pain that radiates to L arm. Pt reports having myelogram yesterday.

## 2015-05-14 ENCOUNTER — Encounter: Payer: Self-pay | Admitting: Anesthesiology

## 2015-05-14 ENCOUNTER — Telehealth: Payer: Self-pay

## 2015-05-14 ENCOUNTER — Observation Stay: Payer: Worker's Compensation | Admitting: Registered Nurse

## 2015-05-14 LAB — CBC
HCT: 43.4 % (ref 40.0–52.0)
HEMOGLOBIN: 14.9 g/dL (ref 13.0–18.0)
MCH: 29.2 pg (ref 26.0–34.0)
MCHC: 34.3 g/dL (ref 32.0–36.0)
MCV: 85.3 fL (ref 80.0–100.0)
Platelets: 163 10*3/uL (ref 150–440)
RBC: 5.09 MIL/uL (ref 4.40–5.90)
RDW: 12.9 % (ref 11.5–14.5)
WBC: 8.3 10*3/uL (ref 3.8–10.6)

## 2015-05-14 LAB — BASIC METABOLIC PANEL
Anion gap: 4 — ABNORMAL LOW (ref 5–15)
BUN: 14 mg/dL (ref 6–20)
CALCIUM: 8.5 mg/dL — AB (ref 8.9–10.3)
CHLORIDE: 109 mmol/L (ref 101–111)
CO2: 28 mmol/L (ref 22–32)
CREATININE: 0.94 mg/dL (ref 0.61–1.24)
Glucose, Bld: 122 mg/dL — ABNORMAL HIGH (ref 65–99)
Potassium: 3.7 mmol/L (ref 3.5–5.1)
SODIUM: 141 mmol/L (ref 135–145)

## 2015-05-14 MED ORDER — HYDROCODONE-ACETAMINOPHEN 5-325 MG PO TABS
1.0000 | ORAL_TABLET | ORAL | Status: DC | PRN
  Administered 2015-05-14 – 2015-05-15 (×7): 2 via ORAL
  Filled 2015-05-14: qty 2
  Filled 2015-05-14: qty 1
  Filled 2015-05-14 (×3): qty 2
  Filled 2015-05-14: qty 1
  Filled 2015-05-14 (×2): qty 2

## 2015-05-14 MED ORDER — ENOXAPARIN SODIUM 40 MG/0.4ML ~~LOC~~ SOLN
40.0000 mg | Freq: Every day | SUBCUTANEOUS | Status: DC
  Administered 2015-05-14: 40 mg via SUBCUTANEOUS
  Filled 2015-05-14: qty 0.4

## 2015-05-14 MED ORDER — ACETAMINOPHEN 650 MG RE SUPP: 650.0000 mg | Freq: Four times a day (QID) | RECTAL | Status: DC | PRN

## 2015-05-14 MED ORDER — METHYLPREDNISOLONE SODIUM SUCC 40 MG IJ SOLR
40.0000 mg | Freq: Four times a day (QID) | INTRAMUSCULAR | Status: DC
  Administered 2015-05-14: 40 mg via INTRAVENOUS
  Filled 2015-05-14: qty 1

## 2015-05-14 MED ORDER — SODIUM CHLORIDE 0.9 % IV SOLN
INTRAVENOUS | Status: AC
  Administered 2015-05-14: 02:00:00 via INTRAVENOUS

## 2015-05-14 MED ORDER — MORPHINE SULFATE (PF) 2 MG/ML IV SOLN
2.0000 mg | INTRAVENOUS | Status: DC | PRN
  Administered 2015-05-14 (×4): 2 mg via INTRAVENOUS
  Filled 2015-05-14 (×4): qty 1

## 2015-05-14 MED ORDER — ONDANSETRON HCL 4 MG/2ML IJ SOLN
4.0000 mg | Freq: Four times a day (QID) | INTRAMUSCULAR | Status: DC | PRN
  Administered 2015-05-14 (×2): 4 mg via INTRAVENOUS
  Filled 2015-05-14 (×3): qty 2

## 2015-05-14 MED ORDER — ONDANSETRON HCL 4 MG PO TABS: 4.0000 mg | ORAL_TABLET | Freq: Four times a day (QID) | ORAL | Status: DC | PRN

## 2015-05-14 MED ORDER — ACETAMINOPHEN 325 MG PO TABS: 650.0000 mg | ORAL_TABLET | Freq: Four times a day (QID) | ORAL | Status: DC | PRN

## 2015-05-14 MED ORDER — METHYLPREDNISOLONE SODIUM SUCC 40 MG IJ SOLR
40.0000 mg | Freq: Two times a day (BID) | INTRAMUSCULAR | Status: DC
  Administered 2015-05-15: 40 mg via INTRAVENOUS
  Filled 2015-05-14 (×2): qty 1

## 2015-05-14 MED ORDER — ALBUTEROL SULFATE (2.5 MG/3ML) 0.083% IN NEBU: 3.0000 mL | INHALATION_SOLUTION | Freq: Four times a day (QID) | RESPIRATORY_TRACT | Status: DC | PRN

## 2015-05-14 MED ORDER — SODIUM CHLORIDE 0.9 % IV SOLN
INTRAVENOUS | Status: DC
  Administered 2015-05-14 (×2): via INTRAVENOUS

## 2015-05-14 NOTE — Telephone Encounter (Signed)
Patient called this morning from [facility] where he has been admitted following complications from his lumbar myelogram here two days ago (05/12/15).  States he followed the strict bed rest instructions we gave and explained to him for the first 24 hours.  He began having positional headaches when the 24 hours was over and he was up moving around.  He states he went back to bed, following the strict bed rest instructions.  By last evening the headache was significant even when he was lying flat and his pain was not only in his head but also in the back of his neck and causing his left arm to go numb.  He went to the ED at [facility] and states he has been admitted and that they "are talking about doing a blood patch."  He thanked [place] for taking such good care of him during his myelogram and recovery, saying we "were the best" and made the procedure "actually really fun."  Donell Sievert, RN

## 2015-05-14 NOTE — Progress Notes (Signed)
Pt resting quietly  in bed after blood patch procedure.  NAD, VSS.  Pt rating headache 3/10.  Pt c/o soreness in back around procedure area.  Pt has bandage on back intact.  Blood patch procedure started at 1946 and ended at 2010 per Dr. Charlann Boxer and Bonita Quin, CRNA who assisted.  See Dr. Karilyn Cota notes.  VS printed and placed in pt chart.

## 2015-05-14 NOTE — Progress Notes (Signed)
Pt. Called out with c/o headache getting worse with pain score being 8 out of 10. Morphine  IV administered.

## 2015-05-14 NOTE — Progress Notes (Signed)
Patient ID: Edward Griffin, male   DOB: Mar 23, 1981, 34 y.o.   MRN: [id number] [facility] PROGRESS NOTE  PCP: No primary care provider on file.  HPI/Subjective: Patient had a myelogram on Wednesday. Since then he's been having pain in his head. If he stands up and walks he gets severe pain and has to lie down very quickly. The pain has been getting worse over the last couple days and last night was so severe he came into the hospital area is also having some pains down his left arm. He had a myelogram secondary to being on a workman's comp case. He is had pain down his left leg and muscle atrophy and leg weakness.  Objective: Filed Vitals:   05/14/15 0749  BP: 137/83  Pulse: 56  Temp: 97.4 F (36.3 C)  Resp: 16    Filed Weights   05/13/15 2200 05/14/15 0459  Weight: 108.863 kg (240 lb) 116.801 kg (257 lb 8 oz)    ROS: Review of Systems  Constitutional: Negative for fever and chills.  Eyes: Negative for blurred vision.  Respiratory: Negative for cough and shortness of breath.   Cardiovascular: Negative for chest pain.  Gastrointestinal: Negative for nausea, vomiting, diarrhea and constipation.  Genitourinary: Negative for dysuria.  Musculoskeletal: Positive for back pain. Negative for joint pain.  Neurological: Positive for tingling, focal weakness, weakness and headaches. Negative for dizziness.   Exam: Physical Exam  Constitutional: He is oriented to person, place, and time.  HENT:  Nose: No mucosal edema.  Mouth/Throat: No oropharyngeal exudate or posterior oropharyngeal edema.  Eyes: Conjunctivae, EOM and lids are normal. Pupils are equal, round, and reactive to light.  Neck: No JVD present. Carotid bruit is not present. No edema present. No thyroid mass and no thyromegaly present.  Cardiovascular: S1 normal and S2 normal.  Exam reveals no gallop.   No murmur heard. Pulses:      Dorsalis pedis pulses are 2+ on the right side, and 2+ on the left side.   Respiratory: No respiratory distress. He has no wheezes. He has no rhonchi. He has no rales.  GI: Soft. Bowel sounds are normal. There is no tenderness.  Musculoskeletal:       Right ankle: He exhibits no swelling.       Left ankle: He exhibits no swelling.  Some muscle atrophy left leg.  Lymphadenopathy:    He has no cervical adenopathy.  Neurological: He is alert and oriented to person, place, and time. No cranial nerve deficit.  Able to lift left leg up off the bed with pain.  Skin: Skin is warm. No rash noted. Nails show no clubbing.  Psychiatric: He has a normal mood and affect.    Data Reviewed: Basic Metabolic Panel:  Recent Labs Lab 05/14/15 0213  NA 141  K 3.7  CL 109  CO2 28  GLUCOSE 122*  BUN 14  CREATININE 0.94  CALCIUM 8.5*   CBC:  Recent Labs Lab 05/14/15 0213  WBC 8.3  HGB 14.9  HCT 43.4  MCV 85.3  PLT 163    Scheduled Meds: . enoxaparin (LOVENOX) injection  40 mg Subcutaneous Daily  . methylPREDNISolone (SOLU-MEDROL) injection  40 mg Intravenous Q12H   Continuous Infusions: . sodium chloride 100 mL/hr at 05/14/15 0204    Assessment/Plan:  1. Severe headache as a complication of the myelogram done on Wednesday. I will try to call anesthesia pain management for evaluation of whether a blood patch can be done. Supportive care  with IV fluids and pain medication. 2. Sciatica with left leg pain and numbness and left leg muscle loss- will give Solu-Medrol 40 mg IV every 12 hours. Follow-up with neurosurgery as outpatient. Plans on neurosurgical intervention once approved by workmen's comp. 3. Asthma- respiratory status stable  Code Status:     Code Status Orders        Start     Ordered   05/14/15 0029  Full code   Continuous     05/14/15 0028     Family Communication: Family at bedside Disposition Plan: Home once headache resolved  Consultants:  We'll try to get pain management versus anesthesia to come by and do a blood  patch.  Time spent: 30 minutes  Alford Highland  [facility]

## 2015-05-14 NOTE — Progress Notes (Signed)
Pt. C/o headache 8 out of 10 and request something for pain. Norco x2 administered.

## 2015-05-14 NOTE — Progress Notes (Signed)
Pt back from having blood patch procedure. Pt ordered Lovenox. Spoke with Dr. Esaw Grandchild weather or not he can still receive Lovenox. Lovenox hold this shift and may resume regular diet. Also ordered scd's.

## 2015-05-14 NOTE — Anesthesia Preprocedure Evaluation (Signed)
Anesthesia Evaluation  Patient identified by MRN, date of birth, ID band Patient awake    Reviewed: Allergy & Precautions, NPO status , Patient's Chart, lab work & pertinent test results, reviewed documented beta blocker date and time   History of Anesthesia Complications (+) POST - OP SPINAL HEADACHE and history of anesthetic complications  Airway Mallampati: III  TM Distance: >3 FB     Dental  (+) Chipped   Pulmonary asthma , Current Smoker,          Cardiovascular     Neuro/Psych  Headaches,  Neuromuscular disease    GI/Hepatic   Endo/Other    Renal/GU      Musculoskeletal  (+) Arthritis -,   Abdominal   Peds  Hematology   Anesthesia Other Findings   Reproductive/Obstetrics                             Anesthesia Physical Anesthesia Plan  ASA: II  Anesthesia Plan: Epidural   Post-op Pain Management:    Induction:   Airway Management Planned:   Additional Equipment:   Intra-op Plan:   Post-operative Plan:   Informed Consent: I have reviewed the patients History and Physical, chart, labs and discussed the procedure including the risks, benefits and alternatives for the proposed anesthesia with the patient or authorized representative who has indicated his/her understanding and acceptance.     Plan Discussed with: Anesthesiologist  Anesthesia Plan Comments:         Anesthesia Quick Evaluation

## 2015-05-14 NOTE — Care Management (Signed)
Care management consult ordered.  Patient presents with headache status post myelogram done on 05/12/2015 for evaluation of lumbar adenopathy.  Patient states that his initial back injury was sustained in November of 2015.  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  Per the patient myelogram was performed for workmans comp.   Patient lives at home with his girlfriend and 4 children.  Patient uses [facility] in [place] to obtain his medications.  Patient states there are no issues in obtaining his medication.    Prior to Clinical research associate entering room patient was taking non assisted shower. No RNCM needs anticipated at this time.  To follow for discharge planning.

## 2015-05-14 NOTE — Progress Notes (Addendum)
PER DR THOMAS ON ROUNDS, PT TO BE NPO FOR BLOOD PATCH AT 8PM. RN CONTACTED DR Renae Gloss FOR IVF ORDERS. DR Renae Gloss ORDERS NS AT 100/HR

## 2015-05-14 NOTE — Progress Notes (Signed)
Pt. Admitted from the ED during the night with a spinal headache after a lumbar puncture yesterday. Headache is worse when he sits up. Meds administered per MAR. Pt. Says meds relieve his headache to some degree but doesn;t completely take it away. Up to bathroom. Tolerating diet. Resting quietly at this time.

## 2015-05-14 NOTE — Anesthesia Procedure Notes (Signed)
Epidural Patient location during procedure: holding area Start time: 05/14/2015 7:46 PM  Staffing Anesthesiologist: Elijio Miles F Performed by: anesthesiologist   Preanesthetic Checklist Completed: patient identified, site marked, surgical consent, pre-op evaluation, timeout performed, IV checked, risks and benefits discussed and monitors and equipment checked  Epidural Patient position: sitting Prep: Betadine Patient monitoring: heart rate and blood pressure Approach: midline Location: L3-L4 Injection technique: LOR air and LOR saline  Needle:  Needle type: Tuohy  Needle gauge: 18 G Needle length: 9 cm Needle insertion depth: 6 cm Catheter size: 20 Guage  Additional Notes Reason for block:Epidural Blood Patch

## 2015-05-14 NOTE — Anesthesia Preprocedure Evaluation (Signed)
Anesthesia Evaluation  Patient identified by MRN, date of birth, ID band Patient awake    Reviewed: Allergy & Precautions, NPO status   Airway Mallampati: II       Dental no notable dental hx.    Pulmonary Current Smoker,    Pulmonary exam normal + decreased breath sounds      Cardiovascular negative cardio ROS Normal cardiovascular exam    Neuro/Psych    GI/Hepatic negative GI ROS, Neg liver ROS,   Endo/Other  negative endocrine ROS  Renal/GU negative Renal ROS  negative genitourinary   Musculoskeletal negative musculoskeletal ROS (+)   Abdominal (+) + obese,   Peds negative pediatric ROS (+)  Hematology negative hematology ROS (+)   Anesthesia Other Findings   Reproductive/Obstetrics negative OB ROS                             Anesthesia Physical Anesthesia Plan  ASA: II  Anesthesia Plan: MAC   Post-op Pain Management:    Induction: Intravenous  Airway Management Planned: Natural Airway  Additional Equipment:   Intra-op Plan:   Post-operative Plan:   Informed Consent: I have reviewed the patients History and Physical, chart, labs and discussed the procedure including the risks, benefits and alternatives for the proposed anesthesia with the patient or authorized representative who has indicated his/her understanding and acceptance.     Plan Discussed with: CRNA  Anesthesia Plan Comments:         Anesthesia Quick Evaluation

## 2015-05-15 MED ORDER — PREDNISONE 5 MG PO TABS: ORAL_TABLET | ORAL | Status: DC

## 2015-05-15 MED ORDER — HYDROCODONE-ACETAMINOPHEN 5-325 MG PO TABS: ORAL_TABLET | ORAL | Status: DC

## 2015-05-15 NOTE — Discharge Summary (Signed)
[facility] [place] [facility]   PATIENT NAME: Edward Griffin    MR#:  [id number]  DATE OF BIRTH:  1981-01-18  DATE OF ADMISSION:  05/13/2015 ADMITTING PHYSICIAN: Crissie Figures, MD  DATE OF DISCHARGE: 05/15/2015  PRIMARY CARE PHYSICIAN: [facility]   ADMISSION DIAGNOSIS:  Spinal headache [G97.1]  DISCHARGE DIAGNOSIS:  Principal Problem:   Spinal headache Active Problems:   Lumbar radiculopathy   Bronchial asthma   SECONDARY DIAGNOSIS:   Past Medical History  Diagnosis Date  . Asthma   . Prostatitis   . Arthritis     HOSPITAL COURSE:   1. Spinal headache. This is a complication of the myelogram that he had on Wednesday. With conservative management his headache did not resolve and he was admitted to the hospital on 05/13/2015. The patient had a blood patch procedure done by anesthesia on 05/14/15. After that procedure he did feel better around 45 minutes later. On 05/15/2015 he was feeling better but still had a slight tinge of a headache. He was able to stand up and walk around. Patient was stable for discharge home. Since he did take a lot of pain medications while here in the hospital I will give him a tapering dose of Vicodin. 2. 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 I will give a steroid taper to see if this helps. Likely will need neurosurgical intervention. 3. Asthma- respiratory status stable  DISCHARGE CONDITIONS:   Satisfactory  CONSULTS OBTAINED:  Treatment Team:  Berdine Addison, MD  DRUG ALLERGIES:   Allergies  Allergen Reactions  . Bee Venom Shortness Of Breath and Swelling  . Gabapentin Other (See Comments)    Reaction: Flu-like symptoms     DISCHARGE MEDICATIONS:   Current Discharge Medication List    START taking these medications   Details  HYDROcodone-acetaminophen (NORCO/VICODIN) 5-325 MG per tablet 1 tablet every six hours for two days; 1  tablet every eight hours for three days; 1 tablet twice a day for three days, one tablet daily for three days Qty: 22 tablet, Refills: 0    predniSONE (DELTASONE) 5 MG tablet 5 tabs day1; 4 tabs day2; 3 tabs day3; 2 tabs day 4; 1 tab day 5,6 Qty: 16 tablet, Refills: 0      CONTINUE these medications which have NOT CHANGED   Details  albuterol (VENTOLIN HFA) 108 (90 BASE) MCG/ACT inhaler Inhale 2 puffs into the lungs every 6 (six) hours as needed. For shortness of breath          DISCHARGE INSTRUCTIONS:   Follow-up with neurosurgery Follow-up one week with [facility].  If you experience worsening of your admission symptoms, develop shortness of breath, life threatening emergency, suicidal or homicidal thoughts you must seek medical attention immediately by calling 911 or calling your MD immediately  if symptoms less severe.  You Must read complete instructions/literature along with all the possible adverse reactions/side effects for all the Medicines you take and that have been prescribed to you. Take any new Medicines after you have completely understood and accept all the possible adverse reactions/side effects.   Please note  You were cared for by a hospitalist during your hospital stay. If you have any questions about your discharge medications or the care you received while you were in the hospital after you are discharged, you can call the unit and asked to speak with the hospitalist on call if the hospitalist that took care  of you is not available. Once you are discharged, your primary care physician will handle any further medical issues. Please note that NO REFILLS for any discharge medications will be authorized once you are discharged, as it is imperative that you return to your primary care physician (or establish a relationship with a primary care physician if you do not have one) for your aftercare needs so that they can reassess your need for medications and monitor  your lab values.    Today   CHIEF COMPLAINT:   Chief Complaint  Patient presents with  . Headache  . Arm Pain  . Neck Pain    HISTORY OF PRESENT ILLNESS:  Edward Griffin  is a 34 y.o. male with a known history of sciatica, presented with severe headache after myelogram.   VITAL SIGNS:  Blood pressure 112/61, pulse 73, temperature 97.8 F (36.6 C), temperature source Oral, resp. rate 16, height  (1.854 m), weight 120.657 kg (266 lb), SpO2 99 %.  I/O:   Intake/Output Summary (Last 24 hours) at 05/15/15 1001 Last data filed at 05/15/15 0423  Gross per 24 hour  Intake 1381.67 ml  Output      2 ml  Net 1379.67 ml    PHYSICAL EXAMINATION:  GENERAL:  34 y.o.-year-old patient lying in the bed with no acute distress.  EYES: Pupils equal, round, reactive to light and accommodation. No scleral icterus. Extraocular muscles intact.  HEENT: Head atraumatic, normocephalic. Oropharynx and nasopharynx clear.  NECK:  Supple, no jugular venous distention. No thyroid enlargement, no tenderness.  LUNGS: Normal breath sounds bilaterally, no wheezing, rales,rhonchi or crepitation. No use of accessory muscles of respiration.  CARDIOVASCULAR: S1, S2 normal. No murmurs, rubs, or gallops.  ABDOMEN: Soft, non-tender, non-distended. Bowel sounds present. No organomegaly or mass.  EXTREMITIES: No pedal edema, cyanosis, or clubbing.  NEUROLOGIC: Cranial nerves II through XII are intact.  PSYCHIATRIC: The patient is alert and oriented x 3.  SKIN: No obvious rash, lesion, or ulcer.   DATA REVIEW:   CBC  Recent Labs Lab 05/14/15 0213  WBC 8.3  HGB 14.9  HCT 43.4  PLT 163    Chemistries   Recent Labs Lab 05/14/15 0213  NA 141  K 3.7  CL 109  CO2 28  GLUCOSE 122*  BUN 14  CREATININE 0.94  CALCIUM 8.5*    Management plans discussed with the patient, family and they are in agreement.  CODE STATUS:     Code Status Orders        Start     Ordered   05/14/15 0029   Full code   Continuous     05/14/15 0028      TOTAL TIME TAKING CARE OF THIS PATIENT: 35 minutes.    Alford Highland M.D on 05/15/2015 at 10:01 AM  Between 7am to 6pm - Pager - [phone]  After 6pm go to [email or web address] - password [email or web address] [facility]  [facility]  Office  [phone]  CC: Primary care physician; [facility]

## 2015-05-15 NOTE — Discharge Instructions (Signed)
Epidural Blood Patch for Spinal Headache  An epidural blood patch is a procedure that is used to treat a headache that occurs when there is a leak of spinal fluid. This type of headache is called a spinal headache or post-dural puncture headache. Spinal headaches sometimes occur after a person undergoes a type of anesthesia called epidural anesthesia or after a lumbar puncture (also called a spinal tap).   Generally, an epidural blood patch is done when a spinal headache has not been relieved by 2-3 days of:   · Bed rest.    · Drinking lots of fluids.    · Taking oral medicines for pain, including nonsteroidal anti-inflammatory agents and caffeine.  It may also be done to treat a person who has had epidural anesthesia and is experiencing:   · Neck pain and stiffness that are very severe and associated with vomiting.    · Hearing loss.    · Double vision.    An epidural blood patch is not done when:   · Your headache is due to an infection in the lower back (lumbar) area or the blood.    · You have bleeding tendencies.    · You are taking certain blood-thinning medicines.  LET YOUR HEALTH CARE PROVIDER KNOW ABOUT:  · Any allergies you have.    · All medicines you are taking, including vitamins, herbs, eye drops, creams, and over-the-counter medicines.    · Previous problems you or members of your family have had with the use of anesthetics.    · Any blood disorders you have.    · Previous surgeries you have had.    · Medical conditions you have.    RISKS AND COMPLICATIONS  Generally, an epidural blood patch is a safe procedure. However, as with any procedure, complications can occur. Possible complications include:   · Backache.  · Nerve pain, tingling, or numbness.  · Bleeding.  · Infection.  Complications are more likely to occur in people who have bleeding disorders or infections.  BEFORE THE PROCEDURE  · Drink a lot of water the day before your procedure.  · Make sure your health care provider knows about all  medicines and dietary or herbal supplements that you are taking. Take them as directed and find out if you need to stop any of them prior to the procedure.  · Follow your health care provider's instructions for when to stop eating and drinking.  · Arrange for someone to drive you to and from the procedure.  PROCEDURE   · You will have two IV lines placed--one to give you fluids and medicines during the procedure and one to withdraw blood for the patch.  · You will lie on your stomach.  · An X-ray machine will take pictures of your back to locate the area of leakage.  · Dye may be injected so that the area can be seen well on an X-ray.  · Blood will be drawn from your arm and injected into the leaking area. When the blood is injected, you may feel tightness in your buttocks, lower back, or thighs.  AFTER THE PROCEDURE   You will be expected to lie on your back for 2-4 hours with some pillows under your knees. It is important to lie still while on your back so that a good clot can form. You should also avoid any straining, especially right after the procedure.   Most people obtain almost instant relief from the spinal headache. In some, the relief comes on gradually over a 24-hour period. Some people experience mild   backaches for a few days. In a few cases, people also have a mild, passing sensation of prickly or tingly skin (paresthesia), neck pain, or nerve-root pain.  Document Released: 02/17/2002 Document Revised: 06/18/2013 Document Reviewed: 04/09/2013  [employer]® Patient Information ©2015 [employer]. This information is not intended to replace advice given to you by your health care provider. Make sure you discuss any questions you have with your health care provider.

## 2015-05-16 ENCOUNTER — Observation Stay
Admission: EM | Admit: 2015-05-16 | Discharge: 2015-05-18 | Disposition: A | Discharge status: Home / Self Care | Payer: Worker's Compensation | Attending: Internal Medicine | Admitting: Internal Medicine

## 2015-05-16 ENCOUNTER — Observation Stay: Payer: Worker's Compensation

## 2015-05-16 ENCOUNTER — Encounter: Payer: Self-pay | Admitting: Emergency Medicine

## 2015-05-16 DIAGNOSIS — Z888 Allergy status to other drugs, medicaments and biological substances status: Secondary | ICD-10-CM | POA: Insufficient documentation

## 2015-05-16 DIAGNOSIS — R51 Headache: Secondary | ICD-10-CM | POA: Insufficient documentation

## 2015-05-16 DIAGNOSIS — R519 Headache, unspecified: Secondary | ICD-10-CM

## 2015-05-16 DIAGNOSIS — M1990 Unspecified osteoarthritis, unspecified site: Secondary | ICD-10-CM | POA: Diagnosis not present

## 2015-05-16 DIAGNOSIS — F17200 Nicotine dependence, unspecified, uncomplicated: Secondary | ICD-10-CM | POA: Diagnosis not present

## 2015-05-16 DIAGNOSIS — G971 Other reaction to spinal and lumbar puncture: Principal | ICD-10-CM | POA: Insufficient documentation

## 2015-05-16 DIAGNOSIS — Z79899 Other long term (current) drug therapy: Secondary | ICD-10-CM | POA: Diagnosis not present

## 2015-05-16 DIAGNOSIS — J45909 Unspecified asthma, uncomplicated: Secondary | ICD-10-CM | POA: Diagnosis not present

## 2015-05-16 DIAGNOSIS — Z91030 Bee allergy status: Secondary | ICD-10-CM | POA: Diagnosis not present

## 2015-05-16 LAB — CBC
HEMATOCRIT: 45 % (ref 40.0–52.0)
HEMOGLOBIN: 15.3 g/dL (ref 13.0–18.0)
MCH: 29 pg (ref 26.0–34.0)
MCHC: 34 g/dL (ref 32.0–36.0)
MCV: 85.2 fL (ref 80.0–100.0)
Platelets: 174 10*3/uL (ref 150–440)
RBC: 5.28 MIL/uL (ref 4.40–5.90)
RDW: 12.8 % (ref 11.5–14.5)
WBC: 7.6 10*3/uL (ref 3.8–10.6)

## 2015-05-16 LAB — CREATININE, SERUM
Creatinine, Ser: 0.92 mg/dL (ref 0.61–1.24)
GFR calc Af Amer: >60 mL/min (ref >60)

## 2015-05-16 MED ORDER — ONDANSETRON HCL 4 MG/2ML IJ SOLN: 4.0000 mg | Freq: Four times a day (QID) | INTRAMUSCULAR | Status: DC | PRN

## 2015-05-16 MED ORDER — ONDANSETRON HCL 4 MG PO TABS: 4.0000 mg | ORAL_TABLET | Freq: Four times a day (QID) | ORAL | Status: DC | PRN

## 2015-05-16 MED ORDER — DOCUSATE SODIUM 100 MG PO CAPS
100.0000 mg | ORAL_CAPSULE | Freq: Two times a day (BID) | ORAL | Status: DC
  Administered 2015-05-16 – 2015-05-17 (×3): 100 mg via ORAL
  Filled 2015-05-16 (×3): qty 1

## 2015-05-16 MED ORDER — MORPHINE SULFATE (PF) 4 MG/ML IV SOLN
4.0000 mg | Freq: Once | INTRAVENOUS | Status: AC
  Administered 2015-05-16: 4 mg via INTRAVENOUS
  Filled 2015-05-16: qty 1

## 2015-05-16 MED ORDER — ACETAMINOPHEN 325 MG PO TABS: 650.0000 mg | ORAL_TABLET | Freq: Four times a day (QID) | ORAL | Status: DC | PRN

## 2015-05-16 MED ORDER — ACETAMINOPHEN 650 MG RE SUPP: 650.0000 mg | Freq: Four times a day (QID) | RECTAL | Status: DC | PRN

## 2015-05-16 MED ORDER — ONDANSETRON HCL 4 MG/2ML IJ SOLN
4.0000 mg | INTRAMUSCULAR | Status: AC
  Administered 2015-05-16: 4 mg via INTRAVENOUS
  Filled 2015-05-16: qty 2

## 2015-05-16 MED ORDER — NICOTINE 10 MG IN INHA
1.0000 | RESPIRATORY_TRACT | Status: DC | PRN
  Administered 2015-05-16: 1 via RESPIRATORY_TRACT
  Filled 2015-05-16: qty 36

## 2015-05-16 MED ORDER — SODIUM CHLORIDE 0.9 % IV BOLUS (SEPSIS)
1000.0000 mL | Freq: Once | INTRAVENOUS | Status: AC
  Administered 2015-05-16: 1000 mL via INTRAVENOUS

## 2015-05-16 MED ORDER — HEPARIN SODIUM (PORCINE) 5000 UNIT/ML IJ SOLN
5000.0000 [IU] | Freq: Three times a day (TID) | INTRAMUSCULAR | Status: DC
  Administered 2015-05-16 – 2015-05-17 (×4): 5000 [IU] via SUBCUTANEOUS
  Filled 2015-05-16 (×5): qty 1

## 2015-05-16 MED ORDER — SODIUM CHLORIDE 0.9 % IV SOLN
INTRAVENOUS | Status: DC
  Administered 2015-05-16 – 2015-05-18 (×4): via INTRAVENOUS

## 2015-05-16 MED ORDER — HYDROMORPHONE HCL 1 MG/ML IJ SOLN
1.0000 mg | INTRAMUSCULAR | Status: DC | PRN
  Administered 2015-05-16 – 2015-05-17 (×7): 1 mg via INTRAVENOUS
  Filled 2015-05-16 (×7): qty 1

## 2015-05-16 MED ORDER — METHYLPREDNISOLONE SODIUM SUCC 125 MG IJ SOLR
125.0000 mg | Freq: Once | INTRAMUSCULAR | Status: AC
  Administered 2015-05-16: 23:00:00 125 mg via INTRAVENOUS
  Filled 2015-05-16: qty 2

## 2015-05-16 MED ORDER — ALBUTEROL SULFATE (2.5 MG/3ML) 0.083% IN NEBU: 3.0000 mL | INHALATION_SOLUTION | RESPIRATORY_TRACT | Status: DC | PRN

## 2015-05-16 MED ORDER — PANTOPRAZOLE SODIUM 40 MG PO TBEC
40.0000 mg | DELAYED_RELEASE_TABLET | Freq: Two times a day (BID) | ORAL | Status: DC
  Administered 2015-05-17 (×2): 40 mg via ORAL
  Filled 2015-05-16 (×2): qty 1

## 2015-05-16 NOTE — ED Provider Notes (Signed)
[facility] Emergency Department Provider Note REMINDER - THIS NOTE IS NOT A FINAL MEDICAL RECORD UNTIL IT IS SIGNED. UNTIL THEN, THE CONTENT BELOW MAY REFLECT INFORMATION FROM A DOCUMENTATION TEMPLATE, NOT THE ACTUAL PATIENT VISIT. ____________________________________________  Time seen: Approximately 3:28 PM  I have reviewed the triage vital signs and the nursing notes.  HISTORY  Chief Complaint Post-op Problem  HPI Edward Griffin is a 34 y.o. male recent admission and discharge yesterday for spinal headache after a myelogram with suspected CSF leak. Patient had a blood patch performed on September 2. Reports he went home and still had a headache at that time, reports these come back disease taking Vicodin at home and the pain is not improving. He reports his headache is severe to the point that he really can't stand up, he has to lay back and can only stand for a minute of time before his headache becomes too terrible. Describes a entire head headache, it is constant and severe across the forehead and into the back of the neck. No fevers or chills. No pain in the neck itself. No pain in his lower back except for his chronic pain due to his nerve impingement on the left leg which is unchanged from previous.   Past Medical History  Diagnosis Date  . Asthma   . Prostatitis   . Arthritis     Patient Active Problem List   Diagnosis Date Noted  . Headache disorder 05/16/2015  . Spinal headache 05/13/2015  . Lumbar radiculopathy 05/13/2015  . Bronchial asthma 05/13/2015    Past Surgical History  Procedure Laterality Date  . Appendectomy      Current Outpatient Rx  Name  Route  Sig  Dispense  Refill  . albuterol (VENTOLIN HFA) 108 (90 BASE) MCG/ACT inhaler   Inhalation   Inhale 2 puffs into the lungs every 6 (six) hours as needed. For shortness of breath          . HYDROcodone-acetaminophen (NORCO/VICODIN) 5-325 MG per tablet      1 tablet every  six hours for two days; 1 tablet every eight hours for three days; 1 tablet twice a day for three days, one tablet daily for three days   22 tablet   0   . predniSONE (DELTASONE) 5 MG tablet      5 tabs day1; 4 tabs day2; 3 tabs day3; 2 tabs day 4; 1 tab day 5,6   16 tablet   0     Allergies Bee venom and Gabapentin  Family History  Problem Relation Age of Onset  . Heart disease Mother   . Thyroid disease Mother   . Hypertension Father   . Diabetes Other     Social History Social History  Substance Use Topics  . Smoking status: Current Every Day Smoker -- 0.50 packs/day  . Smokeless tobacco: None  . Alcohol Use: No    Review of Systems Constitutional: No fever/chills Eyes: No visual changes. ENT: No sore throat. Cardiovascular: Denies chest pain. Respiratory: Denies shortness of breath. Gastrointestinal: No abdominal pain.  No nausea, no vomiting.  No diarrhea.  No constipation. Genitourinary: Negative for dysuria. Musculoskeletal: Negative for back pain. Skin: Negative for rash. Neurological: Negative for  focal weakness or numbness except for long-standing the left leg.  10-point ROS otherwise negative.  ____________________________________________   PHYSICAL EXAM:  VITAL SIGNS: ED Triage Vitals  Enc Vitals Group     BP 05/16/15 1439 157/97 mmHg  Pulse Rate 05/16/15 1439 77     Resp 05/16/15 1439 16     Temp 05/16/15 1439 98 F (36.7 C)     Temp Source 05/16/15 1439 Oral     SpO2 05/16/15 1439 95 %     Weight 05/16/15 1439 250 lb (113.399 kg)     Height 05/16/15 1439 6\' 1"  (1.854 m)     Head Cir --      Peak Flow --      Pain Score 05/16/15 1440 7     Pain Loc --      Pain Edu? --      Excl. in GC? --    Constitutional: Alert and oriented. Appears to be in moderate pain, laying flat with lights off. Eyes: Conjunctivae are normal. PERRL. EOMI. Head: Atraumatic. No photophobia. Patient reports his headache gets much more severe when he sits up  for approximately 1 minute. Nose: No congestion/rhinnorhea. Mouth/Throat: Mucous membranes are moist.  Oropharynx non-erythematous. Neck: No stridor.   Cardiovascular: Normal rate, regular rhythm. Grossly normal heart sounds.  Good peripheral circulation. Respiratory: Normal respiratory effort.  No retractions. Lungs CTAB. Gastrointestinal: Soft and nontender. No distention. No abdominal bruits. No CVA tenderness. Musculoskeletal: No lower extremity tenderness nor edema.  No joint effusions. Left lower extremity slightly atrophied compared to the right. Moves all extremities 5 out of 5 strength. Neurologic:  Normal speech and language. No gross focal neurologic deficits are appreciated. Skin:  Skin is warm, dry and intact. No rash noted. Psychiatric: Mood and affect are normal. Speech and behavior are normal.  ____________________________________________   LABS (all labs ordered are listed, but only abnormal results are displayed)  Labs Reviewed - No data to display ____________________________________________  EKG   ____________________________________________  RADIOLOGY   ____________________________________________   PROCEDURES  Procedure(s) performed: None  Critical Care performed: No  ____________________________________________   INITIAL IMPRESSION / ASSESSMENT AND PLAN / ED COURSE  Pertinent labs & imaging results that were available during my care of the patient were reviewed by me and considered in my medical decision making (see chart for details).  Patient represents with headache. His symptoms are suggestive of a spinal headache. This is probably in the setting of his recent myelogram due to possible CSF leak. I discussed with Dr. Loretha Brasil who advises repeat blood patch, as may take more than one attempt to get relief. I discussed with anesthesiology on call Dr. Karlton Lemon who advises that he will not perform an additional blood patch on this patient as the  department of anesthesiology did not perform his original procedure. I discussed this concern with her house supervisor Elnita Maxwell, reports that she will discuss and return answer to me on how best we can have this patient evaluated for possible need for an additional blood patch. His notable that [facility] anesthesia did perform a blood patch on him just 2 days ago.  No symptoms of infection. Nothing stress meningitis. No focal neurologic deficits except for some atrophy and left lower extremity may be some slight weakness which patient reports is chronic from his lumbar disc herniation. Cranial nerves are normal. No cardiopulmonary symptoms.  Because of severe pain in the patient's parents is, we will treat him with morphine. Discussed with Dr. Loretha Brasil who advises about the only option we have at this point would be pain control and reevaluation for another attempt at blood patch.  ----------------------------------------- 7:14 PM on 05/16/2015 -----------------------------------------  Patient reports that he has improvement in his headache at this time  though still in some discomfort. He does appear improved, discussed with anesthesia and can as the patient will require very close follow-up plan if he is to be discharged. Unfortunately, anesthesia unable to secure follow-up with their service/pain management for evaluation on Tuesday as of yet. I have discussed with Dr. Karlton Lemon of the anesthesia service that I'm requesting consultation in the emergency room for management of his spinal headache, but he reports that they will not see him in consult unless they're called in for a different case. This concerns me, I have again discussed with the house supervisor and requested the administrator on call be involved as it is my opinion is the patient's current treating physician that he would benefit from reevaluation by anesthesia for possible repeat blood patch given his ongoing headache and symptoms which brought  him back to the emergency room 24 hours after discharge. Patient and family aware, we will continue to await a final plan from anesthesia as to whether there able to secure close outpatient follow-up versus evaluation in the ER tonight.    ----------------------------------------- 8:31 PM on 05/16/2015 -----------------------------------------  Will admit the patient to the hospital, plan is to obtain face-to-face neurology consultation along with anesthesia evaluation tomorrow. This case was extensively discussed the house supervisor, anesthesia team, and also spoke with Dr. Loretha Brasil. ____________________________________________   FINAL CLINICAL IMPRESSION(S) / ED DIAGNOSES  Final diagnoses:  Spinal puncture headache      Sharyn Creamer, MD 05/16/15 2032

## 2015-05-16 NOTE — ED Notes (Signed)
Pt states that he had a myelogram 05-12-15, states he has a hx of herniated disc from November, pt states that worker's comp was requiring thee myelogram to check the nerves from the herniated disc. Pt states that he developed a severe headache after the myelogram and returned to the er on sept 1. Pt was admitted and received a blood patch. Pt states that he cont to have a severe headache when he sits up, when he is lying down the pain is better but not completely gone

## 2015-05-16 NOTE — ED Notes (Signed)
Pt seen here on Thursday night after lumbar puncture on Wednesday morning, reports he was leaking spinal fluid. They did a blood patch on Friday night, pt reports continued symptoms; dizziness, headache, pressure in head and neck.

## 2015-05-16 NOTE — H&P (Signed)
History and Physical    [name]:[id number] DOB: 1980-10-14 DOA: 05/16/2015  Referring physician: Dr. Fanny Bien PCP: [name], NP  Specialists: none  Chief Complaint: headache  HPI: Edward Griffin is a 34 y.o. male has a past medical history significant for asthma and recent myelogram for back pain now with spinal headache. Was hospitalized for same 2 days ago and received a blood patch but returns now with recurrent sx's. Afebrile. No relief with IV morphine in the ER.  Review of Systems: The patient denies anorexia, fever, weight loss,, vision loss, decreased hearing, hoarseness, chest pain, syncope, dyspnea on exertion, peripheral edema, balance deficits, hemoptysis, abdominal pain, melena, hematochezia, severe indigestion/heartburn, hematuria, incontinence, genital sores, muscle weakness, suspicious skin lesions, transient blindness, difficulty walking, depression, unusual weight change, abnormal bleeding, enlarged lymph nodes, angioedema, and breast masses.   Past Medical History  Diagnosis Date  . Asthma   . Prostatitis   . Arthritis    Past Surgical History  Procedure Laterality Date  . Appendectomy     Social History:  reports that he has been smoking.  He does not have any smokeless tobacco history on file. He reports that he does not drink alcohol or use illicit drugs.  Allergies  Allergen Reactions  . Bee Venom Shortness Of Breath and Swelling  . Gabapentin Other (See Comments)    Reaction: Flu-like symptoms     Family History  Problem Relation Age of Onset  . Heart disease Mother   . Thyroid disease Mother   . Hypertension Father   . Diabetes Other     Prior to Admission medications   Medication Sig Start Date End Date Taking? Authorizing Provider  albuterol (VENTOLIN HFA) 108 (90 BASE) MCG/ACT inhaler Inhale 2 puffs into the lungs every 6 (six) hours as needed. For shortness of breath    Yes Historical Provider, MD   HYDROcodone-acetaminophen (NORCO/VICODIN) 5-325 MG per tablet 1 tablet every six hours for two days; 1 tablet every eight hours for three days; 1 tablet twice a day for three days, one tablet daily for three days 05/15/15  Yes Alford Highland, MD  predniSONE (DELTASONE) 5 MG tablet 5 tabs day1; 4 tabs day2; 3 tabs day3; 2 tabs day 4; 1 tab day 5,6 05/15/15  Yes Alford Highland, MD   Physical Exam: Filed Vitals:   05/16/15 1730 05/16/15 1803 05/16/15 1830 05/16/15 1900  BP: 147/82 143/85 152/75 126/94  Pulse:  61    Temp:      TempSrc:      Resp:  16    Height:      Weight:      SpO2:  100%       General:  moderate distress  Eyes: PERRL, EOMI, no scleral icterus  ENT: moist oropharynx  Neck: supple, no lymphadenopathy  Cardiovascular: regular rate without MRG; 2+ peripheral pulses, no JVD, no peripheral edema  Respiratory: CTA biL, good air movement without wheezing, rhonchi or crackled  Abdomen: soft, non tender to palpation, positive bowel sounds, no guarding, no rebound  Skin: no rashes  Musculoskeletal: normal bulk and tone, no joint swelling  Psychiatric: normal mood and affect  Neurologic: CN 2-12 grossly intact, MS 5/5 in all 4  Labs on Admission:  Basic Metabolic Panel:  Recent Labs Lab 05/14/15 0213  NA 141  K 3.7  CL 109  CO2 28  GLUCOSE 122*  BUN 14  CREATININE 0.94  CALCIUM 8.5*   Liver Function Tests: No results for  input(s): AST, ALT, ALKPHOS, BILITOT, PROT, ALBUMIN in the last 168 hours. No results for input(s): LIPASE, AMYLASE in the last 168 hours. No results for input(s): AMMONIA in the last 168 hours. CBC:  Recent Labs Lab 05/14/15 0213  WBC 8.3  HGB 14.9  HCT 43.4  MCV 85.3  PLT 163   Cardiac Enzymes: No results for input(s): CKTOTAL, CKMB, CKMBINDEX, TROPONINI in the last 168 hours.  BNP (last 3 results) No results for input(s): BNP in the last 8760 hours.  ProBNP (last 3 results) No results for input(s): PROBNP in the last  8760 hours.  CBG: No results for input(s): GLUCAP in the last 168 hours.  Radiological Exams on Admission: No results found.  EKG: Independently reviewed.  Assessment/Plan Active Problems:   Headache disorder   Will observe on the floor with IV Dialudid for pain control. Reconsult Neurology and Anesthesia. Begin IV fluids.  Diet: clear liq  Fluids: NS@100  DVT Prophylaxis: SQ Heparin  Code Status: Full  Family Communication: yes  Disposition Plan: home  Time spent: 45 min

## 2015-05-17 DIAGNOSIS — R51 Headache: Secondary | ICD-10-CM

## 2015-05-17 LAB — BASIC METABOLIC PANEL
Anion gap: 8 (ref 5–15)
BUN: 20 mg/dL (ref 6–20)
CHLORIDE: 106 mmol/L (ref 101–111)
CO2: 28 mmol/L (ref 22–32)
Calcium: 9.4 mg/dL (ref 8.9–10.3)
Creatinine, Ser: 0.98 mg/dL (ref 0.61–1.24)
GFR calc Af Amer: >60 mL/min (ref >60)
GFR calc non Af Amer: >60 mL/min (ref >60)
GLUCOSE: 139 mg/dL — AB (ref 65–99)
POTASSIUM: 4.5 mmol/L (ref 3.5–5.1)
Sodium: 142 mmol/L (ref 135–145)

## 2015-05-17 LAB — CBC
HEMATOCRIT: 48.4 % (ref 40.0–52.0)
Hemoglobin: 16.3 g/dL (ref 13.0–18.0)
MCH: 28.9 pg (ref 26.0–34.0)
MCHC: 33.8 g/dL (ref 32.0–36.0)
MCV: 85.6 fL (ref 80.0–100.0)
Platelets: 189 10*3/uL (ref 150–440)
RBC: 5.65 MIL/uL (ref 4.40–5.90)
RDW: 13 % (ref 11.5–14.5)
WBC: 10.3 10*3/uL (ref 3.8–10.6)

## 2015-05-17 MED ORDER — MORPHINE SULFATE (PF) 2 MG/ML IV SOLN
2.0000 mg | INTRAVENOUS | Status: DC | PRN
  Administered 2015-05-17 – 2015-05-18 (×3): 2 mg via INTRAVENOUS
  Filled 2015-05-17 (×3): qty 1

## 2015-05-17 MED ORDER — HYDROCODONE-ACETAMINOPHEN 10-325 MG PO TABS
1.0000 | ORAL_TABLET | ORAL | Status: DC | PRN
  Administered 2015-05-17: 1 via ORAL
  Filled 2015-05-17: qty 1

## 2015-05-17 MED ORDER — OXYCODONE-ACETAMINOPHEN 5-325 MG PO TABS
1.0000 | ORAL_TABLET | Freq: Four times a day (QID) | ORAL | Status: DC | PRN
Start: 2015-05-17 — End: 2015-05-17
  Administered 2015-05-17: 16:00:00 1 via ORAL
  Filled 2015-05-17: qty 1

## 2015-05-17 MED ORDER — PNEUMOCOCCAL VAC POLYVALENT 25 MCG/0.5ML IJ INJ: 0.5000 mL | INJECTION | INTRAMUSCULAR | Status: DC

## 2015-05-17 NOTE — Progress Notes (Signed)
[facility] - [place] [facility]   PATIENT NAME: Edward Griffin    MR#:  [id number]  DATE OF BIRTH:  1981/05/16  SUBJECTIVE:  CHIEF COMPLAINT:   Chief Complaint  Patient presents with  . Post-op Problem    Still have headache- positional.   Had blood patch 3 days ago- but started having pain again.  REVIEW OF SYSTEMS:  CONSTITUTIONAL: No fever, fatigue or weakness. Have severe headache. EYES: No blurred or double vision.  EARS, NOSE, AND THROAT: No tinnitus or ear pain.  RESPIRATORY: No cough, shortness of breath, wheezing or hemoptysis.  CARDIOVASCULAR: No chest pain, orthopnea, edema.  GASTROINTESTINAL: No nausea, vomiting, diarrhea or abdominal pain.  GENITOURINARY: No dysuria, hematuria.  ENDOCRINE: No polyuria, nocturia,  HEMATOLOGY: No anemia, easy bruising or bleeding SKIN: No rash or lesion. MUSCULOSKELETAL: No joint pain or arthritis.   NEUROLOGIC: No tingling, numbness, weakness.  PSYCHIATRY: No anxiety or depression.   ROS  DRUG ALLERGIES:   Allergies  Allergen Reactions  . Bee Venom Shortness Of Breath and Swelling  . Gabapentin Other (See Comments)    Reaction: Flu-like symptoms     VITALS:  Blood pressure 148/88, pulse 82, temperature 97.6 F (36.4 C), temperature source Oral, resp. rate 20, height  (1.854 m), weight 113.49 kg (250 lb 3.2 oz), SpO2 98 %.  PHYSICAL EXAMINATION:  GENERAL:  34 y.o.-year-old patient lying in the bed with no acute distress.  EYES: Pupils equal, round, reactive to light and accommodation. No scleral icterus. Extraocular muscles intact.  HEENT: Head atraumatic, normocephalic. Oropharynx and nasopharynx clear.  NECK:  Supple, no jugular venous distention. No thyroid enlargement, no tenderness.  LUNGS: Normal breath sounds bilaterally, no wheezing, rales,rhonchi or crepitation. No use of accessory muscles of respiration.  CARDIOVASCULAR: S1, S2 normal. No murmurs, rubs, or gallops.   ABDOMEN: Soft, nontender, nondistended. Bowel sounds present. No organomegaly or mass.  EXTREMITIES: No pedal edema, cyanosis, or clubbing.  NEUROLOGIC: Cranial nerves II through XII are intact. Muscle strength 5/5 in all extremities. Sensation intact. Gait not checked.  PSYCHIATRIC: The patient is alert and oriented x 3.  SKIN: No obvious rash, lesion, or ulcer.   Physical Exam LABORATORY PANEL:   CBC  Recent Labs Lab 05/17/15 0518  WBC 10.3  HGB 16.3  HCT 48.4  PLT 189   ------------------------------------------------------------------------------------------------------------------  Chemistries   Recent Labs Lab 05/17/15 0518  NA 142  K 4.5  CL 106  CO2 28  GLUCOSE 139*  BUN 20  CREATININE 0.98  CALCIUM 9.4   ------------------------------------------------------------------------------------------------------------------  Cardiac Enzymes No results for input(s): TROPONINI in the last 168 hours. ------------------------------------------------------------------------------------------------------------------  RADIOLOGY:  Ct Head Wo Contrast  05/16/2015   CLINICAL DATA:  Headache. Patient had a myelogram on 05/12/2015 and developed severe headaches subsequently. Patient returned to the ED on September 1 and was admitted received a blood patch. Patient continues to have severe headache when he sits up.  EXAM: CT HEAD WITHOUT CONTRAST  TECHNIQUE: Contiguous axial images were obtained from the base of the skull through the vertex without intravenous contrast.  COMPARISON:  None.  FINDINGS: Ventricles and sulci appear symmetrical. No mass effect or midline shift. No abnormal extra-axial fluid collections. Gray-white matter junctions are distinct. Basal cisterns are not effaced. No evidence of acute intracranial hemorrhage. No depressed skull fractures. Visualized paranasal sinuses and mastoid air cells are not opacified.  IMPRESSION: No acute intracranial abnormalities.    Electronically Signed   By: Marisa Cyphers.D.  On: 05/16/2015 21:46    ASSESSMENT AND PLAN:   34 year old male with history of lumbar adenopathy presents with severe headache following lumbar puncture to undergo lumbar myelogram on 05/12/2015. 1. Headache-spinal, following lumbar puncture. Normal neuro examination, no febrile illness. bedrest, IV hydration, IV pain control medications.   he had blood patch 3 days ago- he is seen by neurologist- and he suggested to have it again.   Anesthesiologist is reluctant to do it again- and suggested to have fluoroscopy guided procedure by pain control.   I called pain consult.  2. Left-sided lumbar radicle neuropathy, status post recent lumbar myelo gram. Stable on when necessary pain medications. 3. History of bronchial asthma, mild. Stable, no acute problems. Monitor.  All the records are reviewed and case discussed with Care Management/Social Workerr. Management plans discussed with the patient, family and they are in agreement.  CODE STATUS: full  TOTAL TIME TAKING CARE OF THIS PATIENT: 45 minutes.     POSSIBLE D/C IN 1-2 DAYS, DEPENDING ON CLINICAL CONDITION.   Altamese Dilling M.D on 05/17/2015   Between 7am to 6pm - Pager - [phone]  After 6pm go to [email or web address] - password [email or web address] [facility]  [facility]  Office  [phone]  CC: Primary care physician; [name], NP

## 2015-05-17 NOTE — Progress Notes (Signed)
   05/17/15 0930  Clinical Encounter Type  Visited With Patient  Visit Type Initial  Referral From Nurse  Consult/Referral To Chaplain  Spiritual Encounters  Spiritual Needs Literature  Advance Directives (For Healthcare)  Does patient have an advance directive? No  Would patient like information on creating an advanced directive? Yes - Educational materials given  Provided Advance Directive forms to patient.  Patient asked me to return later to discuss and answer any questions.  [employer] Susie Ehresman-pager [phone]

## 2015-05-17 NOTE — Plan of Care (Signed)
Problem: Discharge Progression Outcomes Goal: Other Discharge Outcomes/Goals Outcome: Progressing Plan of care progress to goals:  Discharge plan- pt will be discharge to home when stable. Pain controlled- c/o headache. Improved with PRN IV and Oral pain medication. Hemodynamically stable- VSS, neurological assessment WDL.   Diet- Clear Liquid Diet. Tolerating diet. No c/o nausea or vomiting. Activity- Moderate fall risk, Instructed to call for assistance if needed. Verbalized understanding.

## 2015-05-17 NOTE — Consult Note (Signed)
CC: Headache  HPI: Edward Griffin is an 34 y.o. male  has a past medical history significant for asthma and recent myelogram for back pain now with spinal headache. Pt s/p d/c 2 days ago with similar complaints which are diffuse pressure like positional headache.  Headache is still same as prior to blood patch.    Past Medical History  Diagnosis Date  . Asthma   . Prostatitis   . Arthritis     Past Surgical History  Procedure Laterality Date  . Appendectomy      Family History  Problem Relation Age of Onset  . Heart disease Mother   . Thyroid disease Mother   . Hypertension Father   . Diabetes Other     Social History:  reports that he has been smoking.  He does not have any smokeless tobacco history on file. He reports that he does not drink alcohol or use illicit drugs.  Allergies  Allergen Reactions  . Bee Venom Shortness Of Breath and Swelling  . Gabapentin Other (See Comments)    Reaction: Flu-like symptoms     Medications: I have reviewed the patient's current medications.  ROS: History obtained from the patient  General ROS: negative for - chills, fatigue, fever, night sweats, weight gain or weight loss Psychological ROS: negative for - behavioral disorder, hallucinations, memory difficulties, mood swings or suicidal ideation Ophthalmic ROS: negative for - blurry vision, double vision, eye pain or loss of vision ENT ROS: negative for - epistaxis, nasal discharge, oral lesions, sore throat, tinnitus or vertigo Allergy and Immunology ROS: negative for - hives or itchy/watery eyes Hematological and Lymphatic ROS: negative for - bleeding problems, bruising or swollen lymph nodes Endocrine ROS: negative for - galactorrhea, hair pattern changes, polydipsia/polyuria or temperature intolerance Respiratory ROS: negative for - cough, hemoptysis, shortness of breath or wheezing Cardiovascular ROS: negative for - chest pain, dyspnea on exertion, edema or irregular  heartbeat Gastrointestinal ROS: negative for - abdominal pain, diarrhea, hematemesis, nausea/vomiting or stool incontinence Genito-Urinary ROS: negative for - dysuria, hematuria, incontinence or urinary frequency/urgency Musculoskeletal ROS: negative for - joint swelling or muscular weakness Neurological ROS: as noted in HPI Dermatological ROS: negative for rash and skin lesion changes  Physical Examination: Blood pressure 166/96, pulse 63, temperature 98.2 F (36.8 C), temperature source Oral, resp. rate 18, height 6\' 1"  (1.854 m), weight 113.49 kg (250 lb 3.2 oz), SpO2 95 %.   Neurological Examination Mental Status: Alert, oriented, thought content appropriate.  Speech fluent without evidence of aphasia.  Able to follow 3 step commands without difficulty. Cranial Nerves: II: Discs flat bilaterally; Visual fields grossly normal, pupils equal, round, reactive to light and accommodation III,IV, VI: ptosis not present, extra-ocular motions intact bilaterally V,VII: smile symmetric, facial light touch sensation normal bilaterally VIII: hearing normal bilaterally IX,X: gag reflex present XI: bilateral shoulder shrug XII: midline tongue extension Motor: Right : Upper extremity   5/5    Left:     Upper extremity   5/5  Lower extremity   5/5     Lower extremity   5/5 Tone and bulk:normal tone throughout; no atrophy noted Sensory: Pinprick and light touch intact throughout, bilaterally Deep Tendon Reflexes: 2+ and symmetric throughout Plantars: Right: downgoing   Left: downgoing Cerebellar: normal finger-to-nose, normal rapid alternating movements and normal heel-to-shin test Gait: not tested     Laboratory Studies:   Basic Metabolic Panel:  Recent Labs Lab 05/14/15 0213 05/16/15 2302 05/17/15 0518  NA 141  --  142  K 3.7  --  4.5  CL 109  --  106  CO2 28  --  28  GLUCOSE 122*  --  139*  BUN 14  --  20  CREATININE 0.94 0.92 0.98  CALCIUM 8.5*  --  9.4    Liver Function  Tests: No results for input(s): AST, ALT, ALKPHOS, BILITOT, PROT, ALBUMIN in the last 168 hours. No results for input(s): LIPASE, AMYLASE in the last 168 hours. No results for input(s): AMMONIA in the last 168 hours.  CBC:  Recent Labs Lab 05/14/15 0213 05/16/15 2302 05/17/15 0518  WBC 8.3 7.6 10.3  HGB 14.9 15.3 16.3  HCT 43.4 45.0 48.4  MCV 85.3 85.2 85.6  PLT 163 174 189    Cardiac Enzymes: No results for input(s): CKTOTAL, CKMB, CKMBINDEX, TROPONINI in the last 168 hours.  BNP: Invalid input(s): POCBNP  CBG: No results for input(s): GLUCAP in the last 168 hours.  Microbiology: No results found for this or any previous visit.  Coagulation Studies: No results for input(s): LABPROT, INR in the last 72 hours.  Urinalysis: No results for input(s): COLORURINE, LABSPEC, PHURINE, GLUCOSEU, HGBUR, BILIRUBINUR, KETONESUR, PROTEINUR, UROBILINOGEN, NITRITE, LEUKOCYTESUR in the last 168 hours.  Invalid input(s): APPERANCEUR  Lipid Panel:  No results found for: CHOL, TRIG, HDL, CHOLHDL, VLDL, LDLCALC  HgbA1C: No results found for: HGBA1C  Urine Drug Screen:  No results found for: LABOPIA, COCAINSCRNUR, LABBENZ, AMPHETMU, THCU, LABBARB  Alcohol Level: No results for input(s): ETH in the last 168 hours.  Other results: EKG: normal EKG, normal sinus rhythm, unchanged from previous tracings.  Imaging: Ct Head Wo Contrast  05/16/2015   CLINICAL DATA:  Headache. Patient had a myelogram on 05/12/2015 and developed severe headaches subsequently. Patient returned to the ED on September 1 and was admitted received a blood patch. Patient continues to have severe headache when he sits up.  EXAM: CT HEAD WITHOUT CONTRAST  TECHNIQUE: Contiguous axial images were obtained from the base of the skull through the vertex without intravenous contrast.  COMPARISON:  None.  FINDINGS: Ventricles and sulci appear symmetrical. No mass effect or midline shift. No abnormal extra-axial fluid collections.  Gray-white matter junctions are distinct. Basal cisterns are not effaced. No evidence of acute intracranial hemorrhage. No depressed skull fractures. Visualized paranasal sinuses and mastoid air cells are not opacified.  IMPRESSION: No acute intracranial abnormalities.   Electronically Signed   By: Burman Nieves M.D.   On: 05/16/2015 21:46     Assessment/Plan:  34 y.o. male  has a past medical history significant for asthma and recent myelogram for back pain now with spinal headache. Pt s/p d/c 2 days ago with similar complaints which are diffuse pressure like positional headache.  Headache is still same as prior to blood patch.    Headache 5-6/10 appears to be positional. I do believe this is likely repeat headache post myelogram. Unable to stand up do to headache.  Will likely need repeat blood patch by anesthesia/pain management.  Pauletta Browns  05/17/2015, 12:10 PM

## 2015-05-17 NOTE — Plan of Care (Signed)
Problem: Discharge Progression Outcomes Goal: Other Discharge Outcomes/Goals Outcome: Progressing Plan of care progress to goals: Discharge plan- pt will be discharge to home when stable after procedure. Pain controlled- c/o headache and neck pain, improved with PRN IV pain medication. Hemodynamically stable- VSS, neurological assessment WDL.  Diet- tolerating diet, no c/o nausea or vomiting. Activity- moderate fall risk, call for assistance if needed, pt verbalized understanding.  Pt a&o, c/o headache improved with PRN pain medications. Neuro and anesthesia consult ordered. Skin assessment completed and verified with Dahlia Client, RN. IVF infusing per MD order, heparin, solumedrol, and colace given per order and education handout given.

## 2015-05-17 NOTE — Progress Notes (Signed)
   05/17/15 1045  Clinical Encounter Type  Visited With Patient  Visit Type Follow-up  Gave patient education on HCPOA and Living Will.  Pt uncertain if he will complete while here because "my head is really messed up right now and I don't think I can do it right now." Pt will let us know if he decides to complete while in the hospital.  [facility] Janki Dike-pager [phone]

## 2015-05-18 ENCOUNTER — Encounter: Payer: Self-pay | Admitting: Pain Medicine

## 2015-05-18 ENCOUNTER — Ambulatory Visit: Payer: Worker's Compensation | Admitting: Pain Medicine

## 2015-05-18 ENCOUNTER — Telehealth: Payer: Self-pay | Admitting: Pain Medicine

## 2015-05-18 VITALS — BP 128/87 | HR 57 | Temp 98.2°F | Resp 16 | Ht 73.0 in | Wt 250.0 lb

## 2015-05-18 DIAGNOSIS — G971 Other reaction to spinal and lumbar puncture: Secondary | ICD-10-CM | POA: Insufficient documentation

## 2015-05-18 MED ORDER — HYDROCODONE-ACETAMINOPHEN 10-325 MG PO TABS: 1.0000 | ORAL_TABLET | ORAL | Status: DC | PRN

## 2015-05-18 MED ORDER — CAFFEINE-SODIUM BENZOATE 125-125 MG/ML IJ SOLN
Freq: Once | INTRAMUSCULAR | Status: DC
  Filled 2015-05-18: qty 1000

## 2015-05-18 NOTE — Telephone Encounter (Signed)
Dr. Juliene Pina would like to talk with Dr. Metta Clines about possible blood patch on patient who had ct mylegram and has headache please call dr mody at [phone]

## 2015-05-18 NOTE — Progress Notes (Signed)
Subjective:    Patient ID: Edward Griffin, male    DOB: February 27, 1981, 34 y.o.   MRN: [id number]  HPI Patient is 34 year old gentleman who comes to pain management Center at the request of primary care physician Edward Griffin for further evaluation and treatment of headache. Patient is status post lumbar puncture for CT myelogram. Patient received epidural blood patch for dural puncture headache with transient relief of pain. Patient presently with complaint of pain consisting of headache. After discussion of patient's condition and evaluation of patient's decision was made to proceed with caffeine sodium benzoate infusion instead of repeating epidural blood patch for treatment of pleural puncture headache the patient was with understanding and agreement with suggested treatment plan. The patient is status post on-the-job injury in November of last year. Patient states that he underwent lumbar epidural steroid injection 2 without relief of pain. The patient was evaluated by Dr. Gerlene Griffin and there has been decision to consider patient for surgical intervention. On today's visit we discussed patient's condition and also informed patient that we could proceed with lumbosacral selective nerve root block which may be of significant benefit in terms of reducing patient's pain and symptoms the patient was in agreement and decision was made for [place] to request Worker's Compensation approval for lumbosacral selective nerve root block to be performed in attempt to decrease severity of patient's symptoms, minimize progression of patient's symptoms, and hopefully avoid the need for more involved treatment. The patient was understanding and in agreement with suggested treatment plan. Patient described his pain as aching throbbing shooting type sensation radiating from the lumbar region to the left lower extremity and headache occurring in the neck and in the back of the head. The patient remained upright in sitting  during most of the evaluation and treatment. The patient also was transported sitting in wheelchair from the hospital to the pain clinic and from the pain clinic to the hospital and was without complaint of headache nausea or vomiting. Patient was without complaint of sensitivity to light and remain in a lighted room sitting most of the time during the evaluation and treatment. We will request [employer] compensation to perform lumbosacral selective nerve root blocks as previously mentioned. The patient was with understanding and was in agreement with plan.     Review of Systems      Cardiovascular Unremarkable  Pulmonary Asthma Positive cigarette smoking  Neurological Scoliosis  Psychological Unremarkable  Gastrointestinal Unremarkable  Genitourinary Unremarkable  Hematological Unremarkable  Endocrine Unremarkable  Rheumatological Unremarkable  Musculoskeletal Unremarkable  Other significant Unremarkable    Objective:   Physical Exam  The patient was well-developed well-nourished male seen sitting in wheelchair in no acute distress  There was mild tends to palpation of paraspinal musculature region cervical region cervical facet region No new lesions of the head and neck were noted. There was mild tends to palpation of the splenius capitis and occipitalis musculature regions. No bounding pulsations of the temporal regions were noted. There was no excessive tends to palpation of paraspinal muscles of the cervical region cervical facet region or cervical musculature region. There was no excessive tends to palpation of paraspinal musculature region of the thoracic region thoracic facet region or thoracic musculature region. Patient appeared to be with unremarkable Spurling's maneuver. Patient appeared to be with bilaterally equal grip strength. Tinel and Phalen's maneuver were without increase of pain significant degree. There was no crepitus of the thoracic region noted.  Palpation over the lumbar paraspinal muscular region lumbar facet  region was a tends to palpation left greater than right. There was decreased straight leg raising on the left compared to the right with decreased EHL strength left compared to the right DTRs were difficult to elicit. 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 Mild disc and facet degeneration elsewhere without evidence of neural impingement    PROCEDURE  CAFFEINE SODIUM BENZOATE INFUSION 500 mg of caffeine sodium benzoate mixed  1 L of normal saline was infused intravenously over one hour for treatment of dural puncture headache. Patient was monitored with EKG, blood pressure, pulse, and pulse oximetry monitoring throughout the procedure and  the patient was in the supine position at the beginning of procedure and the  patient assumed sitting position at times during the procedure. The patient tolerated  the procedure well without significant complaint of headache at end of the procedure.   RECOMMENDATIONS and PLAN  Continue present medications . Would encourage patient to drink plenty of caffeine-containing fluids unless there is some other reason why patient cannot consume caffeine-containing products. The patient denied any reason why he could not consume  caffeine-containing products  Lumbosacral selective nerve block. We will request Worker's Compensation approval for lumbosacral selective nerve root block to be performed at L3 L4 L5 and S1 on the left side in attempt to decrease severity of patient's symptoms, minimize progression of symptoms, and avoid the need for more involved treatment. The procedure was discussed with patient who was with understanding and agreed with suggested treatment plan  F/U PCP Edward Griffin Dr.Moody and other physician  for evaliation of headache BP and general medical  condition as discussed  F/U surgical evaluation. . Patient will follow-up with Dr. Gerlene Griffin in this regard  F/U neurological evaluation. May consider pending follow-up evaluations  May consider radiofrequency rhizolysis or intraspinal procedures pending response to present treatment and F/U evaluation   Patient to call Pain Management Center should patient have concerns prior to scheduled return appointment.

## 2015-05-18 NOTE — Care Management (Addendum)
Admitted to [facility] under observation status with the diagnosis of headache.  Lives with his parents. Mother is Ladona Horns [phone]) Goes to [facility] for physician care and medications.  Continues with headaches. To Pain Clinic for evaluation. Gwenette Greet RN MSN Care Management [phone]

## 2015-05-18 NOTE — Patient Instructions (Addendum)
Continue present medications   Lumbosacral selective nerve root block could be performed and may be of significant benefit in terms of treating the lower back and lower extremity pain and paresthesias  Minimize your activity and advance your activity as tolerated  F/U PCP  Marshia Ly for evaliation of  Headache, BP, and general medical  condition after discharge from hospital  F/U surgical evaluation. Follow-up with Dr. Gerlene Fee as discussed  F/U neurological evaluation. May consider pending follow-up evaluations. We may consider further neurological evaluation of your headache and further studies pending response to present treatment  May consider radiofrequency rhizolysis or intraspinal procedures pending response to present treatment and F/U evaluation   Patient to call Pain Management Center should patient have concerns prior to scheduled return appointment. Pain Management Discharge Instructions  General Discharge Instructions :  If you need to reach your doctor call: Monday-Friday 8:00 am - 4:00 pm at [phone] or toll free [phone].  After clinic hours [phone] to have operator reach doctor.  Bring all of your medication bottles to all your appointments in the pain clinic.  To cancel or reschedule your appointment with Pain Management please remember to call 24 hours in advance to avoid a fee.  Refer to the educational materials which you have been given on: General Risks, I had my Procedure. Discharge Instructions, Post Sedation.  Post Procedure Instructions:  The drugs you were given will stay in your system until tomorrow, so for the next 24 hours you should not drive, make any legal decisions or drink any alcoholic beverages.  You may eat anything you prefer, but it is better to start with liquids then soups and crackers, and gradually work up to solid foods.  Please notify your doctor immediately if you have any unusual bleeding, trouble breathing or pain that is  not related to your normal pain.  Depending on the type of procedure that was done, some parts of your body may feel week and/or numb.  This usually clears up by tonight or the next day.  Walk with the use of an assistive device or accompanied by an adult for the 24 hours.  You may use ice on the affected area for the first 24 hours.  Put ice in a Ziploc bag and cover with a towel and place against area 15 minutes on 15 minutes off.  You may switch to heat after 24 hours.GENERAL RISKS AND COMPLICATIONS  What are the risk, side effects and possible complications? Generally speaking, most procedures are safe.  However, with any procedure there are risks, side effects, and the possibility of complications.  The risks and complications are dependent upon the sites that are lesioned, or the type of nerve block to be performed.  The closer the procedure is to the spine, the more serious the risks are.  Great care is taken when placing the radio frequency needles, block needles or lesioning probes, but sometimes complications can occur. 1. Infection: Any time there is an injection through the skin, there is a risk of infection.  This is why sterile conditions are used for these blocks.  There are four possible types of infection. 1. Localized skin infection. 2. Central Nervous System Infection-This can be in the form of Meningitis, which can be deadly. 3. Epidural Infections-This can be in the form of an epidural abscess, which can cause pressure inside of the spine, causing compression of the spinal cord with subsequent paralysis. This would require an emergency surgery to decompress, and there  are no guarantees that the patient would recover from the paralysis. 4. Discitis-This is an infection of the intervertebral discs.  It occurs in about 1% of discography procedures.  It is difficult to treat and it may lead to surgery.        2. Pain: the needles have to go through skin and soft tissues, will cause  soreness.       3. Damage to internal structures:  The nerves to be lesioned may be near blood vessels or    other nerves which can be potentially damaged.       4. Bleeding: Bleeding is more common if the patient is taking blood thinners such as  aspirin, Coumadin, Ticiid, Plavix, etc., or if he/she have some genetic predisposition  such as hemophilia. Bleeding into the spinal canal can cause compression of the spinal  cord with subsequent paralysis.  This would require an emergency surgery to  decompress and there are no guarantees that the patient would recover from the  paralysis.       5. Pneumothorax:  Puncturing of a lung is a possibility, every time a needle is introduced in  the area of the chest or upper back.  Pneumothorax refers to free air around the  collapsed lung(s), inside of the thoracic cavity (chest cavity).  Another two possible  complications related to a similar event would include: Hemothorax and Chylothorax.   These are variations of the Pneumothorax, where instead of air around the collapsed  lung(s), you may have blood or chyle, respectively.       6. Spinal headaches: They may occur with any procedures in the area of the spine.       7. Persistent CSF (Cerebro-Spinal Fluid) leakage: This is a rare problem, but may occur  with prolonged intrathecal or epidural catheters either due to the formation of a fistulous  track or a dural tear.       8. Nerve damage: By working so close to the spinal cord, there is always a possibility of  nerve damage, which could be as serious as a permanent spinal cord injury with  paralysis.       9. Death:  Although rare, severe deadly allergic reactions known as "Anaphylactic  reaction" can occur to any of the medications used.      10. Worsening of the symptoms:  We can always make thing worse.  What are the chances of something like this happening? Chances of any of this occuring are extremely low.  By statistics, you have more of a chance of  getting killed in a motor vehicle accident: while driving to the hospital than any of the above occurring .  Nevertheless, you should be aware that they are possibilities.  In general, it is similar to taking a shower.  Everybody knows that you can slip, hit your head and get killed.  Does that mean that you should not shower again?  Nevertheless always keep in mind that statistics do not mean anything if you happen to be on the wrong side of them.  Even if a procedure has a 1 (one) in a 1,000,000 (million) chance of going wrong, it you happen to be that one..Also, keep in mind that by statistics, you have more of a chance of having something go wrong when taking medications.  Who should not have this procedure? If you are on a blood thinning medication (e.g. Coumadin, Plavix, see list of "Blood Thinners"), or if you have an active  infection going on, you should not have the procedure.  If you are taking any blood thinners, please inform your physician.  How should I prepare for this procedure?  Do not eat or drink anything at least six hours prior to the procedure.  Bring a driver with you .  It cannot be a taxi.  Come accompanied by an adult that can drive you back, and that is strong enough to help you if your legs get weak or numb from the local anesthetic.  Take all of your medicines the morning of the procedure with just enough water to swallow them.  If you have diabetes, make sure that you are scheduled to have your procedure done first thing in the morning, whenever possible.  If you have diabetes, take only half of your insulin dose and notify our nurse that you have done so as soon as you arrive at the clinic.  If you are diabetic, but only take blood sugar pills (oral hypoglycemic), then do not take them on the morning of your procedure.  You may take them after you have had the procedure.  Do not take aspirin or any aspirin-containing medications, at least eleven (11) days prior to  the procedure.  They may prolong bleeding.  Wear loose fitting clothing that may be easy to take off and that you would not mind if it got stained with Betadine or blood.  Do not wear any jewelry or perfume  Remove any nail coloring.  It will interfere with some of our monitoring equipment.  NOTE: Remember that this is not meant to be interpreted as a complete list of all possible complications.  Unforeseen problems may occur.  BLOOD THINNERS The following drugs contain aspirin or other products, which can cause increased bleeding during surgery and should not be taken for 2 weeks prior to and 1 week after surgery.  If you should need take something for relief of minor pain, you may take acetaminophen which is found in Tylenol,m Datril, Anacin-3 and Panadol. It is not blood thinner. The products listed below are.  Do not take any of the products listed below in addition to any listed on your instruction sheet.  A.P.C or A.P.C with Codeine Codeine Phosphate Capsules #3 Ibuprofen Ridaura  ABC compound Congesprin Imuran rimadil  Advil Cope Indocin Robaxisal  Alka-Seltzer Effervescent Pain Reliever and Antacid Coricidin or Coricidin-D  Indomethacin Rufen  Alka-Seltzer plus Cold Medicine Cosprin Ketoprofen S-A-C Tablets  Anacin Analgesic Tablets or Capsules Coumadin Korlgesic Salflex  Anacin Extra Strength Analgesic tablets or capsules CP-2 Tablets Lanoril Salicylate  Anaprox Cuprimine Capsules Levenox Salocol  Anexsia-D Dalteparin Magan Salsalate  Anodynos Darvon compound Magnesium Salicylate Sine-off  Ansaid Dasin Capsules Magsal Sodium Salicylate  Anturane Depen Capsules Marnal Soma  APF Arthritis pain formula Dewitt's Pills Measurin Stanback  Argesic Dia-Gesic Meclofenamic Sulfinpyrazone  Arthritis Bayer Timed Release Aspirin Diclofenac Meclomen Sulindac  Arthritis pain formula Anacin Dicumarol Medipren Supac  Analgesic (Safety coated) Arthralgen Diffunasal Mefanamic Suprofen  Arthritis  Strength Bufferin Dihydrocodeine Mepro Compound Suprol  Arthropan liquid Dopirydamole Methcarbomol with Aspirin Synalgos  ASA tablets/Enseals Disalcid Micrainin Tagament  Ascriptin Doan's Midol Talwin  Ascriptin A/D Dolene Mobidin Tanderil  Ascriptin Extra Strength Dolobid Moblgesic Ticlid  Ascriptin with Codeine Doloprin or Doloprin with Codeine Momentum Tolectin  Asperbuf Duoprin Mono-gesic Trendar  Aspergum Duradyne Motrin or Motrin IB Triminicin  Aspirin plain, buffered or enteric coated Durasal Myochrisine Trigesic  Aspirin Suppositories Easprin Nalfon Trillsate  Aspirin with Codeine Ecotrin Regular or  Extra Strength Naprosyn Uracel  Atromid-S Efficin Naproxen Ursinus  Auranofin Capsules Elmiron Neocylate Vanquish  Axotal Emagrin Norgesic Verin  Azathioprine Empirin or Empirin with Codeine Normiflo Vitamin E  Azolid Emprazil Nuprin Voltaren  Bayer Aspirin plain, buffered or children's or timed BC Tablets or powders Encaprin Orgaran Warfarin Sodium  Buff-a-Comp Enoxaparin Orudis Zorpin  Buff-a-Comp with Codeine Equegesic Os-Cal-Gesic   Buffaprin Excedrin plain, buffered or Extra Strength Oxalid   Bufferin Arthritis Strength Feldene Oxphenbutazone   Bufferin plain or Extra Strength Feldene Capsules Oxycodone with Aspirin   Bufferin with Codeine Fenoprofen Fenoprofen Pabalate or Pabalate-SF   Buffets II Flogesic Panagesic   Buffinol plain or Extra Strength Florinal or Florinal with Codeine Panwarfarin   Buf-Tabs Flurbiprofen Penicillamine   Butalbital Compound Four-way cold tablets Penicillin   Butazolidin Fragmin Pepto-Bismol   Carbenicillin Geminisyn Percodan   Carna Arthritis Reliever Geopen Persantine   Carprofen Gold's salt Persistin   Chloramphenicol Goody's Phenylbutazone   Chloromycetin Haltrain Piroxlcam   Clmetidine heparin Plaquenil   Cllnoril Hyco-pap Ponstel   Clofibrate Hydroxy chloroquine Propoxyphen         Before stopping any of these medications, be sure to  consult the physician who ordered them.  Some, such as Coumadin (Warfarin) are ordered to prevent or treat serious conditions such as "deep thrombosis", "pumonary embolisms", and other heart problems.  The amount of time that you may need off of the medication may also vary with the medication and the reason for which you were taking it.  If you are taking any of these medications, please make sure you notify your pain physician before you undergo any procedures.         Selective Nerve Root Block Patient Information  Description: Specific nerve roots exit the spinal canal and these nerves can be compressed and inflamed by a bulging disc and bone spurs.  By injecting steroids on the nerve root, we can potentially decrease the inflammation surrounding these nerves, which often leads to decreased pain.  Also, by injecting local anesthesia on the nerve root, this can provide [place] helpful information to give to your referring doctor if it decreases your pain.  Selective nerve root blocks can be done along the spine from the neck to the low back depending on the location of your pain.   After numbing the skin with local anesthesia, a small needle is passed to the nerve root and the position of the needle is verified using x-ray pictures.  After the needle is in correct position, we then deposit the medication.  You may experience a pressure sensation while this is being done.  The entire block usually lasts less than 15 minutes.  Conditions that may be treated with selective nerve root blocks:  Low back and leg pain  Spinal stenosis  Diagnostic block prior to potential surgery  Neck and arm pain  Post laminectomy syndrome  Preparation for the injection:  1. Do not eat any solid food or dairy products within 6 hours of your appointment. 2. You may drink clear liquids up to 2 hours before an appointment.  Clear liquids include water, black coffee, juice or soda.  No milk or cream please. 3. You  may take your regular medications, including pain medications, with a sip of water before your appointment.  Diabetics should hold regular insulin (if taken separately) and take 1/2 normal NPH dose the morning of the procedure.  Carry some sugar containing items with you to your appointment. 4. A driver must accompany  you and be prepared to drive you home after your procedure. 5. Bring all your current medications with you. 6. An IV may be inserted and sedation may be given at the discretion of the physician. 7. A blood pressure cuff, EKG, and other monitors will often be applied during the procedure.  Some patients may need to have extra oxygen administered for a short period. 8. You will be asked to provide medical information, including allergies, prior to the procedure.  We must know immediately if you are taking blood  Thinners (like Coumadin) or if you are allergic to IV iodine contrast (dye).  Possible side-effects: All are usually temporary  Bleeding from needle site  Light headedness  Numbness and tingling  Decreased blood pressure  Weakness in arms/legs  Pressure sensation in back/neck  Pain at injection site (several days)  Possible complications: All are extremely rare  Infection  Nerve injury  Spinal headache (a headache wore with upright position)  Call if you experience:  Fever/chills associated with headache or increased back/neck pain  Headache worsened by an upright position  New onset weakness or numbness of an extremity below the injection site  Hives or difficulty breathing (go to the emergency room)  Inflammation or drainage at the injection site(s)  Severe back/neck pain greater than usual  New symptoms which are concerning to you  Please note:  Although the local anesthetic injected can often make your back or neck feel good for several hours after the injection the pain will likely return.  It takes 3-5 days for steroids to work on the  nerve root. You may not notice any pain relief for at least one week.  If effective, we will often do a series of 3 injections spaced 3-6 weeks apart to maximally decrease your pain.    If you have any questions, please call [phone] [facility]

## 2015-05-18 NOTE — Progress Notes (Signed)
Safety precautions to be maintained throughout the outpatient stay will include: orient to surroundings, keep bed in low position, maintain call bell within reach at all times, provide assistance with transfer out of bed and ambulation.  

## 2015-05-18 NOTE — Plan of Care (Signed)
Problem: Discharge Progression Outcomes Goal: Other Discharge Outcomes/Goals Outcome: Progressing Plan of care progress to goal: Pain: patient Pain is managed with new order for morphine alternating with norco.                                                  Diet: tolerates meals without difficulty                                                  Mobility, ambulates without difficulty                                                  V/s monitored every 4 hours, labs monitored as ordered.

## 2015-05-18 NOTE — Discharge Summary (Signed)
[facility] [place] [facility]   PATIENT NAME: Edward Griffin    MR#:  [id number]  DATE OF BIRTH:  [birth year]  DATE OF ADMISSION:  05/16/2015 ADMITTING PHYSICIAN: Milagros Loll, MD  DATE OF DISCHARGE: 05/18/2015  PRIMARY CARE PHYSICIAN: [name], NP    ADMISSION DIAGNOSIS:  Spinal puncture headache [G97.1] Headache [R51]  DISCHARGE DIAGNOSIS:  Active Problems:   Headache disorder   SECONDARY DIAGNOSIS:   Past Medical History  Diagnosis Date  . Asthma   . Prostatitis   . Arthritis     HOSPITAL COURSE:  This is a 34 year old male who underwent spinal tap and subsequently had persistent headache. For further details please further H&P.  1. Headache: This is likely secondary to lumbar puncture. Patient was seen and evaluated by neurology as well as pain management. Patient had a blood patch placed prior to admission, however he continued to have ongoing headache.  2. History of asthma, mild not persisting: No acute exacerbation.   DISCHARGE CONDITIONS AND DIET:  Patient is being discharged home in stable condition on a regular diet  CONSULTS OBTAINED:  Treatment Team:  Pauletta Browns, MD  DRUG ALLERGIES:   Allergies  Allergen Reactions  . Bee Venom Shortness Of Breath and Swelling  . Gabapentin Other (See Comments)    Reaction: Flu-like symptoms     DISCHARGE MEDICATIONS:   Current Discharge Medication List    START taking these medications   Details  HYDROcodone-acetaminophen (NORCO) 10-325 MG per tablet Take 1 tablet by mouth every 4 (four) hours as needed for moderate pain. Qty: 30 tablet, Refills: 0      CONTINUE these medications which have NOT CHANGED   Details  albuterol (VENTOLIN HFA) 108 (90 BASE) MCG/ACT inhaler Inhale 2 puffs into the lungs every 6 (six) hours as needed. For shortness of breath       STOP taking these medications     HYDROcodone-acetaminophen (NORCO/VICODIN) 5-325 MG per tablet       predniSONE (DELTASONE) 5 MG tablet               Today   CHIEF COMPLAINT:  Patient's headache has improved.   VITAL SIGNS:  Blood pressure 128/62, pulse 80, temperature 98.5 F (36.9 C), temperature source Oral, resp. rate 20, height  (1.854 m), weight 113.49 kg (250 lb 3.2 oz), SpO2 98 %.   REVIEW OF SYSTEMS:  Review of Systems  Constitutional: Negative for fever, chills and malaise/fatigue.  HENT: Negative for sore throat.   Eyes: Negative for blurred vision.  Respiratory: Negative for cough, hemoptysis, shortness of breath and wheezing.   Cardiovascular: Negative for chest pain, palpitations and leg swelling.  Gastrointestinal: Negative for nausea, vomiting, abdominal pain, diarrhea and blood in stool.  Genitourinary: Negative for dysuria.  Musculoskeletal: Negative for back pain.  Neurological: Positive for headaches (improved). Negative for dizziness and tremors.  Endo/Heme/Allergies: Does not bruise/bleed easily.     PHYSICAL EXAMINATION:  GENERAL:  34 y.o.-year-old patient lying in the bed with no acute distress.  NECK:  Supple, no jugular venous distention. No thyroid enlargement, no tenderness.  LUNGS: Normal breath sounds bilaterally, no wheezing, rales,rhonchi  No use of accessory muscles of respiration.  CARDIOVASCULAR: S1, S2 normal. No murmurs, rubs, or gallops.  ABDOMEN: Soft, non-tender, non-distended. Bowel sounds present. No organomegaly or mass.  EXTREMITIES: No pedal edema, cyanosis, or clubbing.  PSYCHIATRIC: The patient is alert and oriented x 3.  SKIN: No obvious rash, lesion, or ulcer.  DATA REVIEW:   CBC  Recent Labs Lab 05/17/15 0518  WBC 10.3  HGB 16.3  HCT 48.4  PLT 189    Chemistries   Recent Labs Lab 05/17/15 0518  NA 142  K 4.5  CL 106  CO2 28  GLUCOSE 139*  BUN 20  CREATININE 0.98  CALCIUM 9.4    Cardiac Enzymes No results for input(s): TROPONINI in the last 168 hours.  Microbiology Results   @[email or web address] @  RADIOLOGY:  Ct Head Wo Contrast  05/16/2015   CLINICAL DATA:  Headache. Patient had a myelogram on 05/12/2015 and developed severe headaches subsequently. Patient returned to the ED on September 1 and was admitted received a blood patch. Patient continues to have severe headache when he sits up.  EXAM: CT HEAD WITHOUT CONTRAST  TECHNIQUE: Contiguous axial images were obtained from the base of the skull through the vertex without intravenous contrast.  COMPARISON:  None.  FINDINGS: Ventricles and sulci appear symmetrical. No mass effect or midline shift. No abnormal extra-axial fluid collections. Gray-white matter junctions are distinct. Basal cisterns are not effaced. No evidence of acute intracranial hemorrhage. No depressed skull fractures. Visualized paranasal sinuses and mastoid air cells are not opacified.  IMPRESSION: No acute intracranial abnormalities.   Electronically Signed   By: Burman Nieves M.D.   On: 05/16/2015 21:46      Management plans discussed with the patient and he is in agreement. Stable for discharge home  Patient should follow up with PCP in 1 week  CODE STATUS:     Code Status Orders        Start     Ordered   05/16/15 2229  Full code   Continuous     05/16/15 2228      TOTAL TIME TAKING CARE OF THIS PATIENT: 35 minutes.    Dallie Patton M.D on 05/18/2015 at 11:50 AM  Between 7am to 6pm - Pager - [phone] After 6pm go to [email or web address] - password [email or web address] [facility]  [facility]  Office  [phone]  CC: Primary care physician; [name], NP

## 2015-05-18 NOTE — Telephone Encounter (Signed)
Edward Griffin Please have patient come to clinic this morning for evaluation and treatment

## 2015-05-19 ENCOUNTER — Ambulatory Visit: Payer: Self-pay | Admitting: Pain Medicine

## 2015-06-01 ENCOUNTER — Other Ambulatory Visit: Payer: Self-pay | Admitting: Pain Medicine

## 2017-04-01 IMAGING — CT CT L SPINE W/ CM
3 of 8 series · 9 of 33 positions shown, 11 images · non-contrast
Comparison: Lumbar spine MRI 12/25/2014 from [facility]HOSPITAL] [facility]HOSPITAL]. Lumbar
spine radiographs 11/28/2014 and 06/06/2010.

CLINICAL DATA: Lumbar radiculopathy. Left-sided low back/buttock
pain radiating through the posterior left lower extremity.
TECHNIQUE: Contiguous axial images were obtained through the Lumbar spine after
the intrathecal infusion of contrast. Coronal and sagittal
reconstructions were obtained of the axial image sets.

[Series 3: l spine soft · axial · 0.27mm/px · z∈[-242,-242]mm · 1 of 86 slices shown, 2 images]
[im 52/86  soft-tissue]
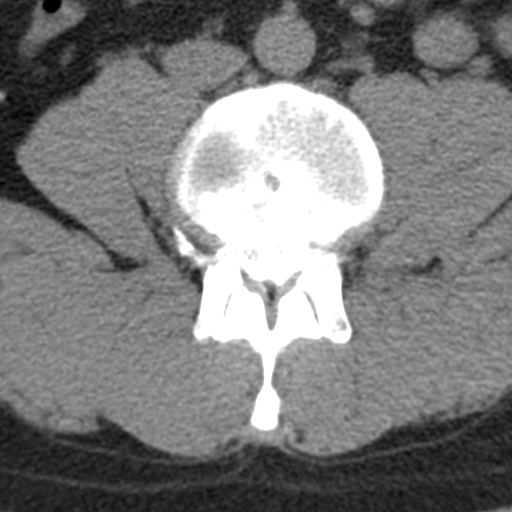
[im 52/86  bone]
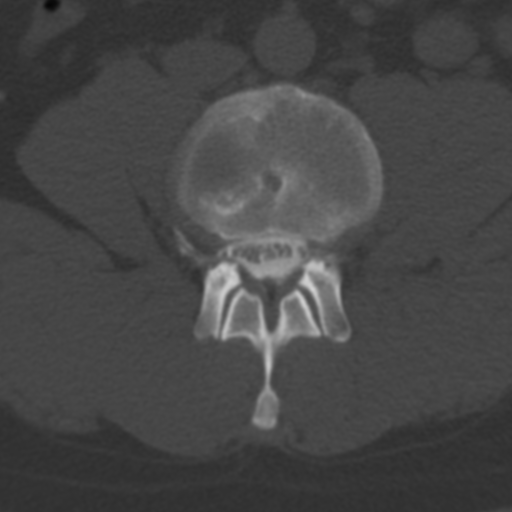

[Series 7: bone cor · coronal · 0.29mm/px · 3 of 31 slices shown]
[im 7/31  bone]
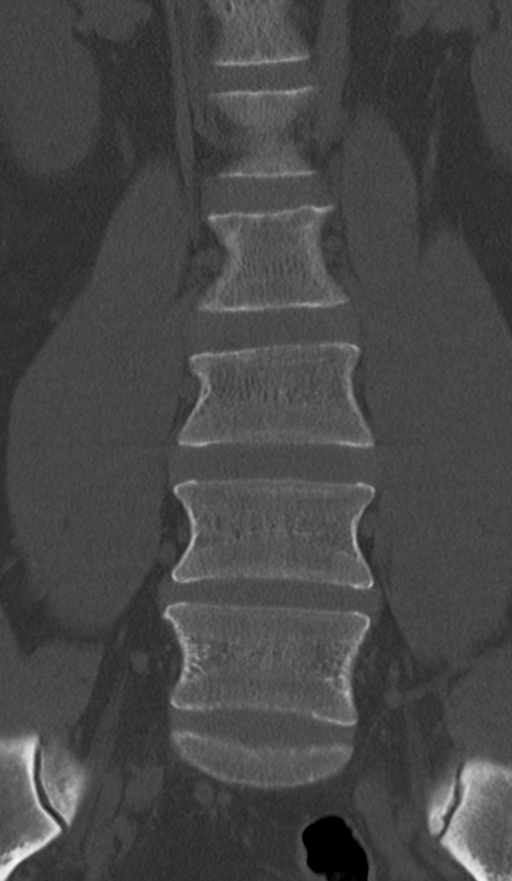
[im 13/31  bone]
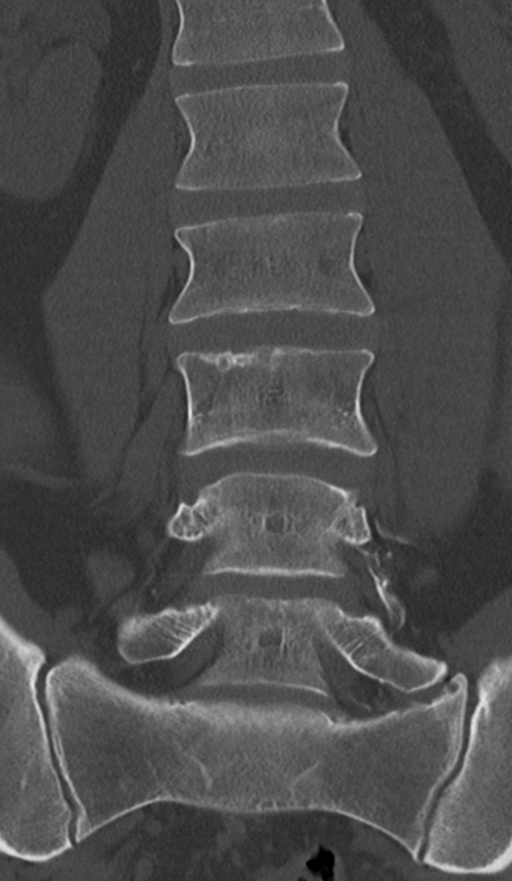
[im 19/31  bone]
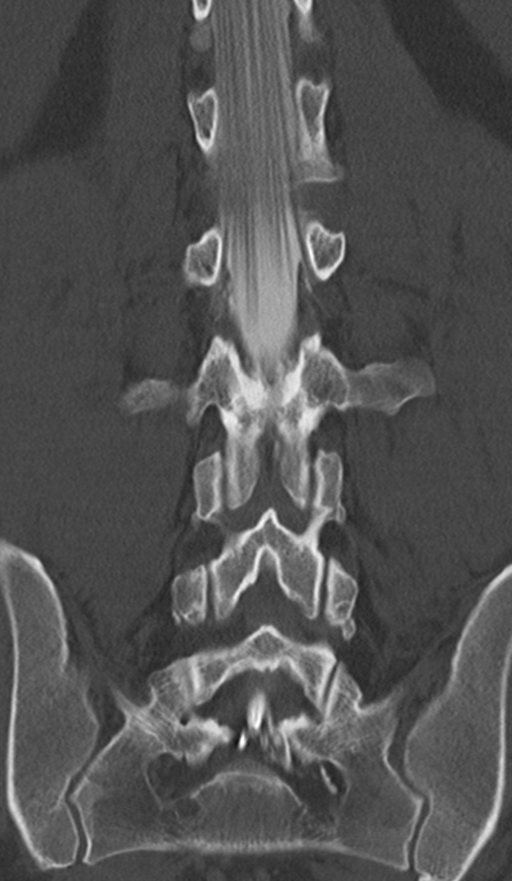

[Series 8: sag bone · sagittal · 0.30mm/px · 5 of 34 slices shown, 6 images]
[im 12/34  bone]
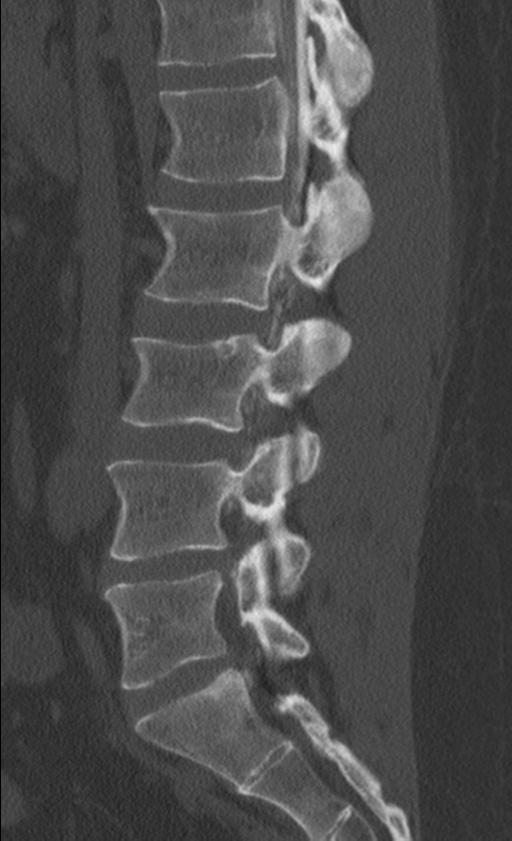
[im 14/34  bone]
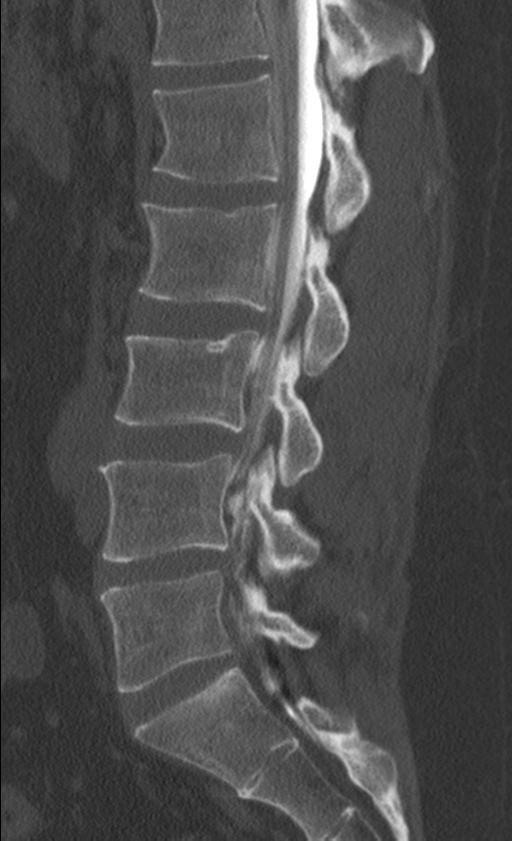
[im 17/34  soft-tissue]
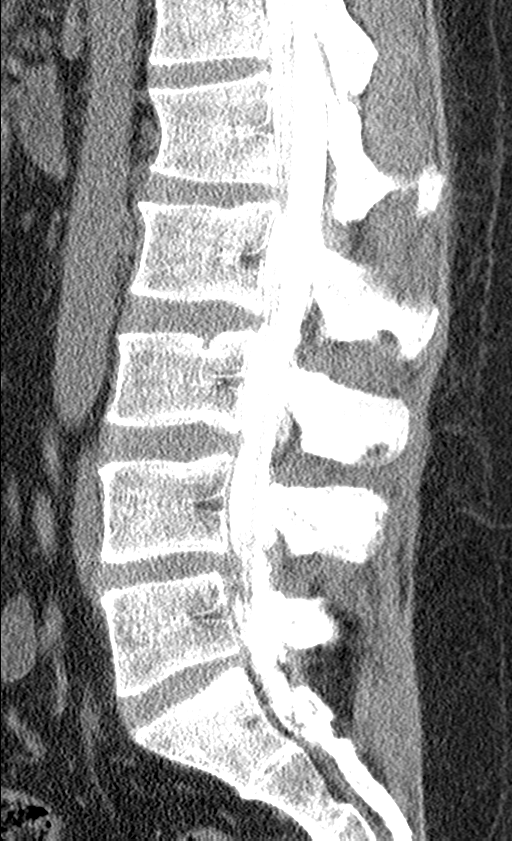
[im 17/34  bone]
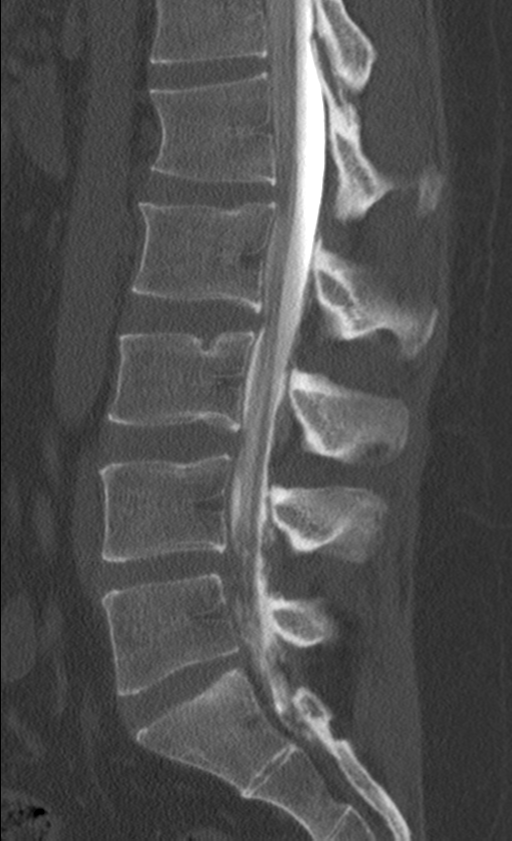
[im 20/34  bone]
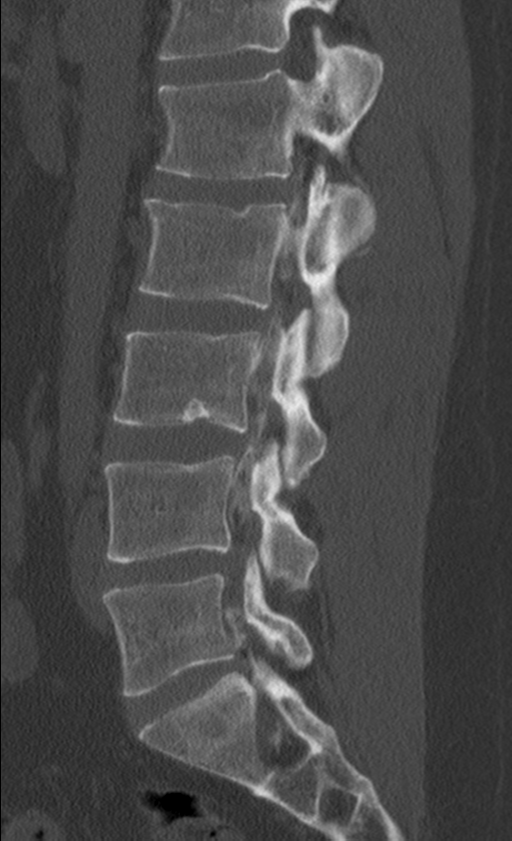
[im 23/34  bone]
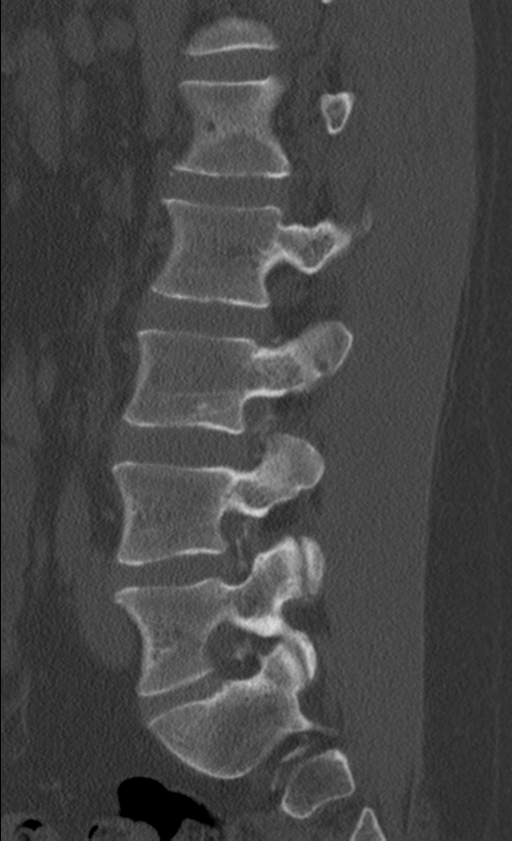

[9 of 33 positions shown; findings below may reference images not displayed]

EXAM:
LUMBAR MYELOGRAM

FLUOROSCOPY TIME:  Fluoroscopy Time (in minutes and seconds): 0
minutes 53 seconds

Number of Acquired Images:  14

PROCEDURE:
After thorough discussion of risks and benefits of the procedure
including bleeding, infection, injury to nerves, blood vessels,
adjacent structures as well as headache and CSF leak, written and
oral informed consent was obtained. Consent was obtained by Dr.
Laisha Ga. Time out form was completed.

Patient was positioned prone on the fluoroscopy table. Local
anesthesia was provided with 1% lidocaine without epinephrine after
prepped and draped in the usual sterile fashion. Puncture was
performed at L3-4 using a 3 1/2 inch 22-gauge spinal needle via a
right paramedian approach. Using a single pass through the dura, the
needle was placed within the thecal sac, with return of clear CSF.
15 mL of 2mnipaque-19V was injected into the thecal sac, with normal
opacification of the nerve roots and cauda equina consistent with
free flow within the subarachnoid space.

I personally performed the lumbar puncture and administered the
intrathecal contrast. I also personally supervised acquisition of
the myelogram images.
FINDINGS: LUMBAR MYELOGRAM FINDINGS:

There is transitional lumbosacral anatomy. For the purposes of this
dictation, the transitional level will be considered a partially
sacralized L5 with left-sided assimilation joint with the sacrum.

Minimal anterior wedging of the L1-L3 vertebral bodies appears
chronic. There is no significant listhesis with upright neutral,
flexion, or extension positioning. Small ventral extradural defects
are present from L2-3 to L5-S1, most notable at L4-5 and slightly
increased with upright compared to prone positioning. There is
attenuation of both L5 nerve root sleeves.

CT LUMBAR MYELOGRAM FINDINGS:

Vertebral alignment is near anatomic, with at most trace
retrolisthesis of L3 on L4 and L4 on L5. There is minimal anterior
wedging of the L1-L3 vertebral bodies which appears chronic. A few
small Schmorl's nodes are noted. The conus medullaris terminates at
T12. A small amount of subdural contrast is noted in the lower
lumbar spine reflecting a slightly mixed injection. Paraspinal soft
tissues are unremarkable.

L1-2:  Negative.

L2-3: Mild disc bulging results in minimal bilateral lateral recess
narrowing without spinal canal or neural foraminal stenosis,
unchanged.

L3-4: Mild disc bulging and mild facet hypertrophy result in minimal
bilateral lateral recess narrowing and borderline spinal stenosis
without neural foraminal stenosis, not significantly changed.

L4-5: A moderate-sized left subarticular disc extrusion results in
severe left lateral recess stenosis with likely impingement of the
left L5 nerve root, which appeared enlarged/edematous on the prior
outside MRI. The disc extrusion, mild circumferential disc bulging,
and moderate facet and ligamentum flavum hypertrophy also result in
mild right lateral recess stenosis and moderate spinal stenosis,
with the findings overall slightly more prominent than on the prior
MRI.

L5-S1:  Mild facet arthrosis without stenosis.
IMPRESSION: 1. Left subarticular disc extrusion at L4-5 with likely left L5
nerve root impingement in the lateral recess. Moderate spinal
stenosis at L4-5.
2. Mild disc and facet degeneration elsewhere as above without
evidence of neural impingement.

## 2019-08-15 ENCOUNTER — Other Ambulatory Visit: Payer: Self-pay

## 2019-08-15 DIAGNOSIS — Z20822 Contact with and (suspected) exposure to covid-19: Secondary | ICD-10-CM

## 2019-08-19 LAB — NOVEL CORONAVIRUS, NAA: SARS-CoV-2, NAA: NOT DETECTED

## 2023-02-24 ENCOUNTER — Ambulatory Visit
Admission: EM | Admit: 2023-02-24 | Discharge: 2023-02-24 | Disposition: A | Discharge status: Home / Self Care | Payer: Self-pay | Attending: Emergency Medicine | Admitting: Emergency Medicine

## 2023-02-24 DIAGNOSIS — Z23 Encounter for immunization: Secondary | ICD-10-CM

## 2023-02-24 DIAGNOSIS — S0561XA Penetrating wound without foreign body of right eyeball, initial encounter: Secondary | ICD-10-CM

## 2023-02-24 MED ORDER — TETANUS-DIPHTH-ACELL PERTUSSIS 5-2.5-18.5 LF-MCG/0.5 IM SUSY
0.5000 mL | PREFILLED_SYRINGE | Freq: Once | INTRAMUSCULAR | Status: AC
  Administered 2023-02-24: 0.5 mL via INTRAMUSCULAR

## 2023-02-24 NOTE — ED Provider Notes (Addendum)
[facility]    CSN: [id number] Arrival date & time: 02/24/23  1411      History   Chief Complaint Chief Complaint  Patient presents with   Eye Injury    HPI Edward Griffin is a 42 y.o. male.   HPI  42 year old male with a past medical history significant for prostatitis, asthma, and arthritis presents for evaluation of right eye injury.  He reports that he was cutting zip ties last night at around 10 or 11 PM and stabbed himself in the eye with a pair of scissors.  He describes having a dull continuous pressure in his eye, possible increased and blurry vision, and tearing.  The patient is prescribed glasses but he does not wear them as they cause headaches.  Visual acuity OD 20/200, OS 20/70, OU 20/70.  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   Headache disorder 05/16/2015   Spinal headache 05/13/2015   Lumbar radiculopathy 05/13/2015   Bronchial asthma 05/13/2015    Past Surgical History:  Procedure Laterality Date   APPENDECTOMY         Home Medications    Prior to Admission medications   Not on File    Family History Family History  Problem Relation Age of Onset   Heart disease Mother    Thyroid disease Mother    Hypertension Father    Diabetes Maternal Grandmother    Diabetes Other     Social History Social History   Tobacco Use   Smoking status: Former    Packs/day: .5    Types: Cigarettes    Quit date: 09/15/2020    Years since quitting: 2.4  Vaping Use   Vaping Use: Never used  Substance Use Topics   Alcohol use: No    Alcohol/week: 0.0 standard drinks of alcohol   Drug use: No     Allergies   Bee venom, Gabapentin, and Amitriptyline   Review of Systems Review of Systems  Eyes:  Positive for pain, discharge, redness and visual disturbance. Negative for photophobia and itching.      Physical Exam Triage Vital Signs ED Triage Vitals  Enc Vitals Group     BP 02/24/23 1429 120/80     Pulse Rate 02/24/23 1429 68     Resp 02/24/23 1429 17     Temp 02/24/23 1429 97.6 F (36.4 C)     Temp Source 02/24/23 1429 Oral     SpO2 02/24/23 1429 94 %     Weight 02/24/23 1426 240 lb (108.9 kg)     Height 02/24/23 1426 6\' 1"  (1.854 m)     Head Circumference --      Peak Flow --      Pain Score 02/24/23 1425 5     Pain Loc --      Pain Edu? --      Excl. in GC? --    No data found.  Updated Vital Signs BP 120/80 (BP Location: Right Arm)   Pulse 68   Temp 97.6 F (36.4 C) (Oral)   Resp 17   Ht 6\' 1"  (1.854 m)   Wt 240 lb (108.9 kg)   SpO2 94%   BMI 31.66 kg/m   Visual Acuity Right Eye Distance:   Left Eye Distance:   Bilateral Distance:    Right Eye  Near:   Left Eye Near:    Bilateral Near:     Physical Exam Vitals and nursing note reviewed.  Constitutional:      Appearance: Normal appearance. He is not ill-appearing.  HENT:     Head: Normocephalic and atraumatic.  Eyes:     Extraocular Movements: Extraocular movements intact.     Pupils: Pupils are equal, round, and reactive to light.  Skin:    General: Skin is warm and dry.  Neurological:     Mental Status: He is alert.      UC Treatments / Results  Labs (all labs ordered are listed, but only abnormal results are displayed) Labs Reviewed - No data to display  EKG   Radiology No results found.  Procedures Procedures (including critical care time)  Medications Ordered in UC Medications  Tdap (BOOSTRIX) injection 0.5 mL (0.5 mLs Intramuscular Given 02/24/23 1505)    Initial Impression / Assessment and Plan / UC Course  I have reviewed the triage vital signs and the nursing notes.  Pertinent labs & imaging results that were available during my care of the patient were reviewed by me and considered in my medical decision making (see chart for details).   The patient is a  nontoxic-appearing 42 year old male presenting for evaluation of puncture wound to the right eye that happened 14 hours ago as outlined in HPI above.    As you can see in images above, there is a subconjunctival hemorrhage in the lower medial corner of the eye.  The patient has a pupil that is equal round reactive and his EOM is intact.  He has normal red light reflex in his right eye.  I anesthetized his eye with 2 drops of tetracaine and instilled fluorescein dye to examine under Woods lamp.  As you can see in the second image there is a bright uptake in the middle of the subconjunctival hemorrhage that I believe is the point of injury.  I am unable to determine the depth.  The patient's globe does appear intact.  Dr. Druscilla Brownie is on-call for [facility].  I have sent him a message through secure epic chat for consultation.  After 20 minutes I attempted to reach Dr. Druscilla Brownie by paging through [facility] and also called the pager number listed in AMION and left a voicemail.  After 15 minutes I advised the patient that he needs to be evaluated by an ophthalmologist and have a slit-lamp examination, which I cannot do here, and therefore I have referred him to the emergency department.  He he will go to [facility] via POV.  At discharge I did have the staff update the patient's tetanus shot as he has not had a tetanus shot since 2012.   Final Clinical Impressions(s) / UC Diagnoses   Final diagnoses:  Penetrating eye injury of right eye, initial encounter     Discharge Instructions      Please go to the emergency department at [facility] for evaluation of your eye injury.     ED Prescriptions   None    PDMP not reviewed this encounter.   Becky Augusta, NP 02/24/23 1539    Becky Augusta, NP 02/24/23 1540

## 2023-02-24 NOTE — Discharge Instructions (Addendum)
Please go to the emergency department at [facility] for evaluation of your eye injury.

## 2023-02-24 NOTE — ED Triage Notes (Signed)
Pt reports he hit his right eye with the point of a scissors. Lots of pressure in right eye and broken blood vessels.  VISUAL ACUITY (Uncorrected) Right eye 20/200 Left eye 20/70 Both eyes 20/70
